# Patient Record
Sex: Female | Born: 1945
Health system: Southern US, Community
[De-identification: ages and names within clinical notes are randomized; demographics above are authoritative.]

## PROBLEM LIST (undated history)

## (undated) DIAGNOSIS — M353 Polymyalgia rheumatica: Secondary | ICD-10-CM

## (undated) DIAGNOSIS — I10 Essential (primary) hypertension: Secondary | ICD-10-CM

## (undated) DIAGNOSIS — F419 Anxiety disorder, unspecified: Secondary | ICD-10-CM

## (undated) DIAGNOSIS — IMO0002 Reserved for concepts with insufficient information to code with codable children: Secondary | ICD-10-CM

## (undated) DIAGNOSIS — M720 Palmar fascial fibromatosis [Dupuytren]: Secondary | ICD-10-CM

## (undated) DIAGNOSIS — F32A Depression, unspecified: Secondary | ICD-10-CM

## (undated) DIAGNOSIS — I4892 Unspecified atrial flutter: Secondary | ICD-10-CM

## (undated) DIAGNOSIS — M1990 Unspecified osteoarthritis, unspecified site: Secondary | ICD-10-CM

## (undated) DIAGNOSIS — R002 Palpitations: Secondary | ICD-10-CM

## (undated) DIAGNOSIS — E041 Nontoxic single thyroid nodule: Secondary | ICD-10-CM

## (undated) DIAGNOSIS — F329 Major depressive disorder, single episode, unspecified: Secondary | ICD-10-CM

## (undated) DIAGNOSIS — E669 Obesity, unspecified: Secondary | ICD-10-CM

## (undated) HISTORY — DX: Polymyalgia rheumatica: M35.3

## (undated) HISTORY — DX: Unspecified osteoarthritis, unspecified site: M19.90

## (undated) HISTORY — DX: Anxiety disorder, unspecified: F41.9

## (undated) HISTORY — DX: Unspecified atrial flutter: I48.92

## (undated) HISTORY — PX: NISSEN FUNDOPLICATION: SHX2091

## (undated) HISTORY — DX: Obesity, unspecified: E66.9

## (undated) HISTORY — DX: Palmar fascial fibromatosis (dupuytren): M72.0

## (undated) HISTORY — DX: Major depressive disorder, single episode, unspecified: F32.9

## (undated) HISTORY — DX: Essential (primary) hypertension: I10

## (undated) HISTORY — DX: Reserved for concepts with insufficient information to code with codable children: IMO0002

## (undated) HISTORY — DX: Nontoxic single thyroid nodule: E04.1

## (undated) HISTORY — DX: Palpitations: R00.2

## (undated) HISTORY — PX: KNEE SURGERY: SHX244

## (undated) HISTORY — PX: ABDOMINAL HYSTERECTOMY: SHX81

## (undated) HISTORY — DX: Depression, unspecified: F32.A

---

## 1980-02-13 HISTORY — PX: BREAST EXCISIONAL BIOPSY: SUR124

## 1998-03-04 ENCOUNTER — Ambulatory Visit (HOSPITAL_COMMUNITY): Admission: RE | Admit: 1998-03-04 | Discharge: 1998-03-04 | Payer: Self-pay | Admitting: Family Medicine

## 1999-03-10 ENCOUNTER — Other Ambulatory Visit: Admission: RE | Admit: 1999-03-10 | Discharge: 1999-03-10 | Payer: Self-pay | Admitting: Family Medicine

## 1999-03-17 ENCOUNTER — Encounter: Payer: Self-pay | Admitting: Family Medicine

## 1999-03-17 ENCOUNTER — Encounter: Admission: RE | Admit: 1999-03-17 | Discharge: 1999-03-17 | Payer: Self-pay | Admitting: Family Medicine

## 1999-04-07 ENCOUNTER — Encounter: Admission: RE | Admit: 1999-04-07 | Discharge: 1999-04-07 | Payer: Self-pay | Admitting: Family Medicine

## 1999-04-07 ENCOUNTER — Encounter: Payer: Self-pay | Admitting: Family Medicine

## 1999-05-21 ENCOUNTER — Emergency Department (HOSPITAL_COMMUNITY): Admission: EM | Admit: 1999-05-21 | Discharge: 1999-05-21 | Payer: Self-pay | Admitting: Emergency Medicine

## 1999-05-21 ENCOUNTER — Encounter: Payer: Self-pay | Admitting: Emergency Medicine

## 1999-07-09 ENCOUNTER — Encounter: Payer: Self-pay | Admitting: Internal Medicine

## 1999-07-09 ENCOUNTER — Emergency Department (HOSPITAL_COMMUNITY): Admission: EM | Admit: 1999-07-09 | Discharge: 1999-07-09 | Payer: Self-pay | Admitting: Emergency Medicine

## 1999-10-09 ENCOUNTER — Encounter: Admission: RE | Admit: 1999-10-09 | Discharge: 1999-10-09 | Payer: Self-pay | Admitting: Family Medicine

## 1999-10-09 ENCOUNTER — Encounter: Payer: Self-pay | Admitting: Family Medicine

## 2000-04-26 ENCOUNTER — Other Ambulatory Visit: Admission: RE | Admit: 2000-04-26 | Discharge: 2000-04-26 | Payer: Self-pay | Admitting: Family Medicine

## 2000-04-26 ENCOUNTER — Encounter: Payer: Self-pay | Admitting: Family Medicine

## 2000-04-26 ENCOUNTER — Encounter: Admission: RE | Admit: 2000-04-26 | Discharge: 2000-04-26 | Payer: Self-pay | Admitting: Family Medicine

## 2000-10-20 ENCOUNTER — Emergency Department (HOSPITAL_COMMUNITY): Admission: EM | Admit: 2000-10-20 | Discharge: 2000-10-20 | Payer: Self-pay | Admitting: Emergency Medicine

## 2000-11-16 ENCOUNTER — Encounter: Admission: RE | Admit: 2000-11-16 | Discharge: 2000-11-16 | Payer: Self-pay | Admitting: Family Medicine

## 2000-11-16 ENCOUNTER — Encounter: Payer: Self-pay | Admitting: Family Medicine

## 2001-01-15 ENCOUNTER — Encounter: Payer: Self-pay | Admitting: Neurology

## 2001-01-15 ENCOUNTER — Ambulatory Visit (HOSPITAL_COMMUNITY): Admission: RE | Admit: 2001-01-15 | Discharge: 2001-01-15 | Payer: Self-pay | Admitting: Neurology

## 2001-04-16 ENCOUNTER — Encounter: Admission: RE | Admit: 2001-04-16 | Discharge: 2001-04-16 | Payer: Self-pay | Admitting: Family Medicine

## 2001-04-16 ENCOUNTER — Encounter: Payer: Self-pay | Admitting: Family Medicine

## 2001-09-26 ENCOUNTER — Encounter: Payer: Self-pay | Admitting: Family Medicine

## 2001-09-26 ENCOUNTER — Encounter: Admission: RE | Admit: 2001-09-26 | Discharge: 2001-09-26 | Payer: Self-pay | Admitting: Radiation Oncology

## 2002-03-31 ENCOUNTER — Other Ambulatory Visit: Admission: RE | Admit: 2002-03-31 | Discharge: 2002-03-31 | Payer: Self-pay | Admitting: Family Medicine

## 2002-07-21 ENCOUNTER — Encounter: Payer: Self-pay | Admitting: Family Medicine

## 2002-07-21 ENCOUNTER — Encounter: Admission: RE | Admit: 2002-07-21 | Discharge: 2002-07-21 | Payer: Self-pay | Admitting: Family Medicine

## 2002-08-19 ENCOUNTER — Encounter: Payer: Self-pay | Admitting: Family Medicine

## 2002-08-19 ENCOUNTER — Encounter: Admission: RE | Admit: 2002-08-19 | Discharge: 2002-08-19 | Payer: Self-pay | Admitting: Family Medicine

## 2002-09-29 ENCOUNTER — Encounter: Admission: RE | Admit: 2002-09-29 | Discharge: 2002-09-29 | Payer: Self-pay | Admitting: Family Medicine

## 2002-09-29 ENCOUNTER — Encounter: Payer: Self-pay | Admitting: Family Medicine

## 2002-11-20 ENCOUNTER — Ambulatory Visit (HOSPITAL_COMMUNITY): Admission: RE | Admit: 2002-11-20 | Discharge: 2002-11-20 | Payer: Self-pay | Admitting: Neurology

## 2002-11-20 ENCOUNTER — Encounter: Payer: Self-pay | Admitting: Neurology

## 2002-12-06 ENCOUNTER — Encounter: Admission: RE | Admit: 2002-12-06 | Discharge: 2002-12-06 | Payer: Self-pay | Admitting: Orthopedic Surgery

## 2002-12-06 ENCOUNTER — Encounter: Payer: Self-pay | Admitting: Orthopedic Surgery

## 2002-12-15 ENCOUNTER — Ambulatory Visit (HOSPITAL_COMMUNITY): Admission: RE | Admit: 2002-12-15 | Discharge: 2002-12-15 | Payer: Self-pay | Admitting: Neurology

## 2003-06-02 ENCOUNTER — Ambulatory Visit (HOSPITAL_COMMUNITY): Admission: RE | Admit: 2003-06-02 | Discharge: 2003-06-02 | Payer: Self-pay | Admitting: Orthopedic Surgery

## 2003-06-02 ENCOUNTER — Ambulatory Visit (HOSPITAL_BASED_OUTPATIENT_CLINIC_OR_DEPARTMENT_OTHER): Admission: RE | Admit: 2003-06-02 | Discharge: 2003-06-02 | Payer: Self-pay | Admitting: Orthopedic Surgery

## 2003-06-18 ENCOUNTER — Encounter: Admission: RE | Admit: 2003-06-18 | Discharge: 2003-06-18 | Payer: Self-pay | Admitting: Family Medicine

## 2003-07-06 ENCOUNTER — Other Ambulatory Visit: Admission: RE | Admit: 2003-07-06 | Discharge: 2003-07-06 | Payer: Self-pay | Admitting: Endocrinology

## 2003-10-11 ENCOUNTER — Encounter: Admission: RE | Admit: 2003-10-11 | Discharge: 2003-10-11 | Payer: Self-pay | Admitting: Endocrinology

## 2003-10-12 ENCOUNTER — Ambulatory Visit (HOSPITAL_COMMUNITY): Admission: RE | Admit: 2003-10-12 | Discharge: 2003-10-12 | Payer: Self-pay | Admitting: Family Medicine

## 2004-03-27 ENCOUNTER — Encounter: Admission: RE | Admit: 2004-03-27 | Discharge: 2004-03-27 | Payer: Self-pay | Admitting: Endocrinology

## 2004-05-09 ENCOUNTER — Ambulatory Visit: Payer: Self-pay | Admitting: Gastroenterology

## 2004-05-11 ENCOUNTER — Ambulatory Visit: Payer: Self-pay | Admitting: Gastroenterology

## 2004-05-18 ENCOUNTER — Ambulatory Visit: Payer: Self-pay | Admitting: Gastroenterology

## 2004-10-12 ENCOUNTER — Ambulatory Visit (HOSPITAL_COMMUNITY): Admission: RE | Admit: 2004-10-12 | Discharge: 2004-10-12 | Payer: Self-pay | Admitting: Family Medicine

## 2005-10-22 ENCOUNTER — Ambulatory Visit (HOSPITAL_COMMUNITY): Admission: RE | Admit: 2005-10-22 | Discharge: 2005-10-22 | Payer: Self-pay | Admitting: Family Medicine

## 2006-05-24 ENCOUNTER — Encounter: Admission: RE | Admit: 2006-05-24 | Discharge: 2006-05-24 | Payer: Self-pay | Admitting: Family Medicine

## 2006-06-11 ENCOUNTER — Encounter: Admission: RE | Admit: 2006-06-11 | Discharge: 2006-06-11 | Payer: Self-pay | Admitting: Family Medicine

## 2006-08-30 ENCOUNTER — Encounter: Admission: RE | Admit: 2006-08-30 | Discharge: 2006-08-30 | Payer: Self-pay | Admitting: Family Medicine

## 2006-09-13 ENCOUNTER — Encounter: Admission: RE | Admit: 2006-09-13 | Discharge: 2006-09-13 | Payer: Self-pay | Admitting: Family Medicine

## 2006-11-01 ENCOUNTER — Encounter: Admission: RE | Admit: 2006-11-01 | Discharge: 2006-11-01 | Payer: Self-pay | Admitting: Family Medicine

## 2006-12-02 ENCOUNTER — Encounter: Admission: RE | Admit: 2006-12-02 | Discharge: 2006-12-02 | Payer: Self-pay | Admitting: Family Medicine

## 2006-12-16 ENCOUNTER — Encounter: Admission: RE | Admit: 2006-12-16 | Discharge: 2006-12-16 | Payer: Self-pay | Admitting: Family Medicine

## 2007-06-26 ENCOUNTER — Encounter: Admission: RE | Admit: 2007-06-26 | Discharge: 2007-06-26 | Payer: Self-pay | Admitting: Family Medicine

## 2007-11-03 ENCOUNTER — Encounter: Admission: RE | Admit: 2007-11-03 | Discharge: 2007-11-03 | Payer: Self-pay | Admitting: Family Medicine

## 2008-11-03 ENCOUNTER — Ambulatory Visit (HOSPITAL_COMMUNITY): Admission: RE | Admit: 2008-11-03 | Discharge: 2008-11-03 | Payer: Self-pay | Admitting: Geriatric Medicine

## 2008-11-09 ENCOUNTER — Encounter: Admission: RE | Admit: 2008-11-09 | Discharge: 2008-11-09 | Payer: Self-pay | Admitting: Geriatric Medicine

## 2008-11-17 ENCOUNTER — Encounter: Admission: RE | Admit: 2008-11-17 | Discharge: 2008-11-17 | Payer: Self-pay | Admitting: Geriatric Medicine

## 2009-04-21 ENCOUNTER — Encounter (INDEPENDENT_AMBULATORY_CARE_PROVIDER_SITE_OTHER): Payer: Self-pay | Admitting: *Deleted

## 2009-04-25 ENCOUNTER — Emergency Department (HOSPITAL_COMMUNITY): Admission: EM | Admit: 2009-04-25 | Discharge: 2009-04-25 | Payer: Self-pay | Admitting: Emergency Medicine

## 2009-05-03 DIAGNOSIS — I498 Other specified cardiac arrhythmias: Secondary | ICD-10-CM

## 2009-05-03 HISTORY — DX: Other specified cardiac arrhythmias: I49.8

## 2009-05-04 ENCOUNTER — Encounter: Admission: RE | Admit: 2009-05-04 | Discharge: 2009-05-04 | Payer: Self-pay | Admitting: Geriatric Medicine

## 2009-05-04 ENCOUNTER — Ambulatory Visit: Payer: Self-pay | Admitting: Internal Medicine

## 2009-05-04 DIAGNOSIS — I4892 Unspecified atrial flutter: Secondary | ICD-10-CM

## 2009-05-04 DIAGNOSIS — I10 Essential (primary) hypertension: Secondary | ICD-10-CM

## 2009-05-04 HISTORY — DX: Essential (primary) hypertension: I10

## 2009-05-04 HISTORY — DX: Unspecified atrial flutter: I48.92

## 2009-05-05 ENCOUNTER — Encounter: Payer: Self-pay | Admitting: Internal Medicine

## 2009-11-04 ENCOUNTER — Encounter: Admission: RE | Admit: 2009-11-04 | Discharge: 2009-11-04 | Payer: Self-pay | Admitting: Geriatric Medicine

## 2009-11-17 ENCOUNTER — Other Ambulatory Visit: Admission: RE | Admit: 2009-11-17 | Discharge: 2009-11-17 | Payer: Self-pay | Admitting: Geriatric Medicine

## 2009-11-21 ENCOUNTER — Encounter: Admission: RE | Admit: 2009-11-21 | Discharge: 2009-11-21 | Payer: Self-pay | Admitting: Geriatric Medicine

## 2010-03-05 ENCOUNTER — Encounter: Payer: Self-pay | Admitting: Family Medicine

## 2010-03-05 ENCOUNTER — Encounter: Payer: Self-pay | Admitting: Endocrinology

## 2010-03-16 NOTE — Letter (Signed)
Summary: ekg 04-25-2009  ekg 04-25-2009   Imported By: Faythe Ghee 05/05/2009 09:55:13  _____________________________________________________________________  External Attachment:    Type:   Image     Comment:   External Document

## 2010-03-16 NOTE — Letter (Signed)
Summary: Colonoscopy Date Change Letter  [facility]  [address] [place], [place] [address]   Phone: [phone]  Fax: [phone]      April 21, 2009 MRN: [id number]   [facility] [address] [place], [place]  [address]   Dear Ms. Reas,   Previously you were recommended to have a repeat colonoscopy around this time. Your chart was recently reviewed by Dr. Jarold Motto of [facility]. Follow up colonoscopy is now recommended in April 2016. This revised recommendation is based on current, nationally recognized guidelines for colorectal cancer screening and polyp surveillance. These guidelines are endorsed by the American Cancer Society, [employer] on Colorectal Cancer as well as numerous other major medical organizations.  Please understand that our recommendation assumes that you do not have any new symptoms such as bleeding, a change in bowel habits, anemia, or significant abdominal discomfort. If you do have any concerning GI symptoms or want to discuss the guideline recommendations, please call to arrange an office visit at your earliest convenience. Otherwise we will keep you in our reminder system and contact you 1-2 months prior to the date listed above to schedule your next colonoscopy.  Thank you,   Vania Rea. Jarold Motto, M.D.  [facility] [phone]

## 2010-03-16 NOTE — Assessment & Plan Note (Signed)
Summary: per Dr. Myrtis Ser seen in er on 3/14 SVT jr   Primary Provider:  Merlene Laughter  CC:  follow up hospital.  History of Present Illness: Melissa Finley is referred today by Dr. Myrtis Ser for evaluation of atrial flutter.  The patient has a h/o tobacco abuse and bronchitis and was seen in the ED with palpitations several weeks ago and was found to be in atrial flutter with 2:1 AV conduction at a rate of 160/min.  She was treated with adenosine by the ED attending on several occaisions demonstrating classic, isthmus dependent atrial flutter.  This eventually terminated.  She notes occaisional palpitations prior to this episode but none since then.  No c/p, sob or other symptoms.  Current Medications (verified): 1)  Alprazolam 0.5 Mg Tabs (Alprazolam) .Marland Kitchen.. 1 Tab Po Bid As Needed 2)  Cyclobenzaprine Hcl 5 Mg Tabs (Cyclobenzaprine Hcl) .Marland Kitchen.. 1 Tab By Mouth 4 Times A Day As Needed 3)  Diltiazem Hcl Er Beads 120 Mg Xr24h-Cap (Diltiazem Hcl Er Beads) .... Take One Capsule By Mouth Daily 4)  Lodrane 24d 12-90 Mg Xr24h-Cap (Brompheniramine-Pseudoeph) .Marland Kitchen.. 1to 2 Tabs By Mouth Once Daily 5)  Tramadol Hcl 50 Mg Tabs (Tramadol Hcl) .Marland Kitchen.. 1 To 2 Tabs Op Once Daily As Needed 6)  Iophen Dm-Nr 100-10 Mg/30ml Liqd (Dextromethorphan-Guaifenesin) .... As Needed 7)  Aspirin 81 Mg Tbec (Aspirin) .... Take One Tablet By Mouth Daily 8)  Aleve 220 Mg Tabs (Naproxen Sodium) .... As Needed 9)  Calcium 1200-1000 Mg-Unit Chew (Calcium Carbonate-Vit D-Min) .Marland Kitchen.. 1 Tab By Mouth Once Daily 10)  Fish Oil   Oil (Fish Oil) .Marland Kitchen.. 1 Tab By Mouth Once Daily 11)  L-Lysine 500 Mg Tabs (Lysine) .Marland Kitchen.. 1 Tab By Mouth Once Daily 12)  Super B Complex  Tabs (B Complex-C) .Marland Kitchen.. 1 Tab By Mouth Once Daily 13)  Bromday 0.09 % Soln (Bromfenac Sodium) .... As Directed 14)  Prednisolone Acetate 1 % Susp (Prednisolone Acetate) .... As Directed  Past History:  Past Medical History: Last updated: 05/03/2009 Current Problems:  SUPRAVENTRICULAR  TACHYCARDIA (ICD-427.89)    Past Surgical History: Knee surgery Nissen fundoplication Hysterectomy C-section  Family History: Cancer Alzheimers dementia CAD  Social History: married with 3 children 1/2 PPD smoker rare ETOH  Review of Systems       All systems reviewed and negative except as noted in the HPI  Vital Signs:  Patient profile:   65 year old female Height:      61 inches Weight:      173 pounds BMI:     32.81 Pulse rate:   86 / minute Resp:     12 per minute BP sitting:   111 / 74  (left arm)  Vitals Entered By: Kem Parkinson (May 04, 2009 8:45 AM)  Physical Exam  General:  Well developed, well nourished, in no acute distress.  HEENT: normal Neck: supple. No JVD. Carotids 2+ bilaterally no bruits Cor: RRR no rubs, gallops or murmur Lungs: CTA Ab: soft, nontender. nondistended. No HSM. Good bowel sounds Ext: warm. no cyanosis, clubbing or edema Neuro: alert and oriented. Grossly nonfocal. affect pleasant    EKG  Procedure date:  05/04/2009  Findings:      Normal sinus rhythm with rate of:  79.  Impression & Recommendations:  Problem # 1:  ATRIAL FLUTTER (ICD-427.32) Her symptoms are fairly minimal at this point and she is back in NSR.  She is not indicated for coumadin at this point and I have asked her  to call me if she has recurrent atrial flutter symptoms.  If so, catheter ablation would be indicated. Her updated medication list for this problem includes:    Aspirin 81 Mg Tbec (Aspirin) .Marland Kitchen... Take one tablet by mouth daily  Problem # 2:  ESSENTIAL HYPERTENSION, BENIGN (ICD-401.1) A low sodium diet is recommended and she will continue her current meds. Her updated medication list for this problem includes:    Diltiazem Hcl Er Beads 120 Mg Xr24h-cap (Diltiazem hcl er beads) .Marland Kitchen... Take one capsule by mouth daily    Aspirin 81 Mg Tbec (Aspirin) .Marland Kitchen... Take one tablet by mouth daily  Patient Instructions: 1)  Your physician wants you to  follow-up in:   12 months with Dr Ladona Ridgel. You will receive a reminder letter in the mail two months in advance. If you don't receive a letter, please call our office to schedule the follow-up appointment. Prescriptions: DILTIAZEM HCL ER BEADS 120 MG XR24H-CAP (DILTIAZEM HCL ER BEADS) Take one capsule by mouth daily  #90 x 3   Entered by:   Proofreader   Authorized by:   Laren Boom, MD, [facility]   Signed by:   Gypsy Balsam RN BSN on 05/04/2009   Method used:   Tax adviser to        [facility]* (mail-order)       [address]       [place], [place]  [address]       Ph: [phone]       Fax: [phone]   RxID:   [id number]

## 2010-04-27 ENCOUNTER — Encounter: Payer: Self-pay | Admitting: Internal Medicine

## 2010-04-27 ENCOUNTER — Ambulatory Visit (INDEPENDENT_AMBULATORY_CARE_PROVIDER_SITE_OTHER): Payer: Medicare Other | Admitting: Internal Medicine

## 2010-04-27 DIAGNOSIS — I4892 Unspecified atrial flutter: Secondary | ICD-10-CM

## 2010-04-27 DIAGNOSIS — I10 Essential (primary) hypertension: Secondary | ICD-10-CM

## 2010-05-02 NOTE — Assessment & Plan Note (Signed)
Summary: per check out/sf /cy/rs per pt call/gd/kl   Visit Type:  Follow-up Primary Provider:  Merlene Laughter   History of Present Illness: Melissa Finley returns today for followup. She is a pleasant 65 yo woman with a h/o Atrial flutter which has been short lived and infrequent in the past. She also has PMR and is on chronic steroids. She has had trouble controlling her weight. She also has HTN. She denies c/p, sob or peripheral edema. No syncope or near syncope.  Current Medications (verified): 1)  Alprazolam 0.5 Mg Tabs (Alprazolam) .Marland Kitchen.. 1 Tab Po Bid As Needed 2)  Cyclobenzaprine Hcl 5 Mg Tabs (Cyclobenzaprine Hcl) .Marland Kitchen.. 1 Tab By Mouth 4 Times A Day As Needed 3)  Diltiazem Hcl Er Beads 120 Mg Xr24h-Cap (Diltiazem Hcl Er Beads) .... Take One Capsule By Mouth Daily 4)  Lodrane 24d 12-90 Mg Xr24h-Cap (Brompheniramine-Pseudoeph) .Marland Kitchen.. 1to 2 Tabs By Mouth Once Daily 5)  Tramadol Hcl 50 Mg Tabs (Tramadol Hcl) .Marland Kitchen.. 1 To 2 Tabs Op Once Daily As Needed 6)  Aspirin 81 Mg Tbec (Aspirin) .... Take One Tablet By Mouth Daily 7)  Aleve 220 Mg Tabs (Naproxen Sodium) .... As Needed 8)  Calcium 1200-1000 Mg-Unit Chew (Calcium Carbonate-Vit D-Min) .Marland Kitchen.. 1 Tab By Mouth Once Daily 9)  Fish Oil   Oil (Fish Oil) .Marland Kitchen.. 1 Tab By Mouth Once Daily 10)  L-Lysine 500 Mg Tabs (Lysine) .Marland Kitchen.. 1 Tab By Mouth Once Daily 11)  Super B Complex  Tabs (B Complex-C) .Marland Kitchen.. 1 Tab By Mouth Once Daily 12)  Prednisone 20 Mg Tabs (Prednisone) .... Take 1 Tablet By Mouth Once A Day  Allergies (verified): No Known Drug Allergies  Past History:  Past Medical History: Last updated: 05/03/2009 Current Problems:  SUPRAVENTRICULAR TACHYCARDIA (ICD-427.89)    Past Surgical History: Last updated: 05/04/2009 Knee surgery Nissen fundoplication Hysterectomy C-section  Review of Systems  The patient denies chest pain, syncope, dyspnea on exertion, and peripheral edema.    Vital Signs:  Patient profile:   65 year old  female Height:      61 inches Weight:      182 pounds BMI:     34.51 Pulse rate:   81 / minute Pulse rhythm:   regular Resp:     18 per minute BP sitting:   129 / 65  (left arm) Cuff size:   large  Vitals Entered By: Vikki Ports (April 27, 2010 2:40 PM)  Physical Exam  General:  Obese, well developed, well nourished, in no acute distress.  HEENT: normal Neck: supple. No JVD. Carotids 2+ bilaterally no bruits Cor: RRR no rubs, gallops or murmur Lungs: CTA Ab: soft, nontender. nondistended. No HSM. Good bowel sounds Ext: warm. no cyanosis, clubbing or edema Neuro: alert and oriented. Grossly nonfocal. affect pleasant    EKG  Procedure date:  04/27/2010  Findings:      Normal sinus rhythm with rate of:  81  Impression & Recommendations:  Problem # 1:  ATRIAL FLUTTER (ICD-427.32) Her symptoms are quite infrequent. I have recommended a period of watchful waiting but if her symptoms worsen, then I would recommend considering ablation. Her updated medication list for this problem includes:    Aspirin 81 Mg Tbec (Aspirin) .Marland Kitchen... Take one tablet by mouth daily  Problem # 2:  ESSENTIAL HYPERTENSION, BENIGN (ICD-401.1) Her blood pressure is fairly well controlled despite her obesity and steroid use. Will follow. She will maintain a low sodium diet. Her updated medication list for this problem  includes:    Diltiazem Hcl Er Beads 120 Mg Xr24h-cap (Diltiazem hcl er beads) .Marland Kitchen... Take one capsule by mouth daily    Aspirin 81 Mg Tbec (Aspirin) .Marland Kitchen... Take one tablet by mouth daily  Patient Instructions: 1)  Your physician recommends that you continue on your current medications as directed. Please refer to the Current Medication list given to you today. 2)  Your physician wants you to follow-up in: YEAR WITH DR Court Joy will receive a reminder letter in the mail two months in advance. If you don't receive a letter, please call our office to schedule the follow-up appointment.

## 2010-05-08 ENCOUNTER — Encounter: Payer: Self-pay | Admitting: Internal Medicine

## 2010-05-08 LAB — CBC
HCT: 47.2 % — ABNORMAL HIGH (ref 36.0–46.0)
Hemoglobin: 16 g/dL — ABNORMAL HIGH (ref 12.0–15.0)
MCHC: 33.9 g/dL (ref 30.0–36.0)
MCV: 99.8 fL (ref 78.0–100.0)

## 2010-05-08 LAB — POCT I-STAT, CHEM 8
Chloride: 109 mEq/L (ref 96–112)
Creatinine, Ser: 0.5 mg/dL (ref 0.4–1.2)
Glucose, Bld: 101 mg/dL — ABNORMAL HIGH (ref 70–99)
HCT: 48 % — ABNORMAL HIGH (ref 36.0–46.0)
Potassium: 4.1 mEq/L (ref 3.5–5.1)
Sodium: 139 mEq/L (ref 135–145)

## 2010-05-08 LAB — DIFFERENTIAL
Eosinophils Relative: 2 % (ref 0–5)
Neutro Abs: 7.5 10*3/uL (ref 1.7–7.7)
Neutrophils Relative %: 65 % (ref 43–77)

## 2010-05-08 LAB — POCT CARDIAC MARKERS

## 2010-05-31 ENCOUNTER — Telehealth: Payer: Self-pay | Admitting: Internal Medicine

## 2010-05-31 NOTE — Telephone Encounter (Signed)
Pt req refill of [facility]iazem [facility] [place]-[facility] day supply

## 2010-06-01 MED ORDER — DILTIAZEM HCL ER COATED BEADS 120 MG PO CP24: 120.0000 mg | ORAL_CAPSULE | Freq: Every day | ORAL | Status: DC

## 2010-06-30 NOTE — Op Note (Signed)
NAME:  Melissa Finley, Melissa Finley                      ACCOUNT NO.:  [id number]   MEDICAL RECORD NO.:  [id number]                   PATIENT TYPE:  AMB   LOCATION:  DSC                                  FACILITY:  [facility]   PHYSICIAN:  Mila Homer. Sherlean Foot, M.D.              DATE OF BIRTH:  Jul 12, 1945   DATE OF PROCEDURE:  06/02/2003  DATE OF DISCHARGE:                                 OPERATIVE REPORT   PREOPERATIVE DIAGNOSIS:  Left knee internal derangement with probable  meniscus tear versus OA versus plica syndrome.   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   SURGEON:  Mila Homer. Sherlean Foot, M.D.   ASSISTANT:  None.   ANESTHESIA:  General LMA.   INDICATIONS FOR PROCEDURE:  The patient is a 65 year old with a negative MRI  six months ago, but continued with  mechanical symptoms, conservative  measures failed and informed consent was obtained.   DESCRIPTION OF PROCEDURE:  The patient was laid supine, administered general  anesthesia, left lower extremity was prepped and draped in the usual sterile  fashion.  #11 blade blunt trocar and cannula were used to create an  inferolateral and inferomedial portal.  A diagnostic arthroscopy was  performed. There was grade 1 chondromalacia on areas of the patella,  virtually none in the trochlea.  There were no loose bodies in the gutters.  __________  flexion, noticed wear on the medial femoral condylar edge from a  large hypertrophied plica.  After debriding with a small gray-white shaver,  it was obvious that this went all the way down to bone and was about an 8 x  8 mm area of grade 4 chondromalacia from the wear of the plica.  Then went  into the medial compartment with valgus stress and the palpated the  posterior horn of the meniscus and it was in fact intact, and there was  virtually no chondromalacia in the medial compartment whatsoever.  The ACL  and PCL also appeared normal.  Then, went  into the figure four position and  palpated the lateral meniscus.  That also appeared normal and there was no  chondromalacia there either.  Then, went back into the flexion position,  finished the debridement and chondroplasty of the area on the medial femoral  condyle, then performed a plicectomy and used the 90 degree arthroscope,  wanted to cauterize bleeding vessels to obtain hemostasis.  I then irrigated  and closed with interrupted 4-0 nylon sutures, dressed with Xeroform  dressings, sponges and a sterile __________  Ace wrap.  Complications none.  Drains none.  Estimated blood loss minimal.  Tourniquet time none.  Mila Homer. Sherlean Foot, M.D.    SDL/MEDQ  D:  06/02/2003  T:  06/03/2003  Job:  [id number]

## 2010-10-17 ENCOUNTER — Other Ambulatory Visit: Payer: Self-pay | Admitting: Geriatric Medicine

## 2010-10-17 DIAGNOSIS — Z1231 Encounter for screening mammogram for malignant neoplasm of breast: Secondary | ICD-10-CM

## 2010-11-06 ENCOUNTER — Ambulatory Visit
Admission: RE | Admit: 2010-11-06 | Discharge: 2010-11-06 | Disposition: A | Discharge status: Home / Self Care | Payer: Medicare Other | Source: Ambulatory Visit | Attending: Geriatric Medicine | Admitting: Geriatric Medicine

## 2010-11-06 DIAGNOSIS — Z1231 Encounter for screening mammogram for malignant neoplasm of breast: Secondary | ICD-10-CM

## 2010-11-20 ENCOUNTER — Other Ambulatory Visit: Payer: Self-pay | Admitting: Geriatric Medicine

## 2010-11-20 DIAGNOSIS — E041 Nontoxic single thyroid nodule: Secondary | ICD-10-CM

## 2010-11-23 ENCOUNTER — Ambulatory Visit
Admission: RE | Admit: 2010-11-23 | Discharge: 2010-11-23 | Disposition: A | Discharge status: Home / Self Care | Payer: Medicare Other | Source: Ambulatory Visit | Attending: Geriatric Medicine | Admitting: Geriatric Medicine

## 2010-11-23 DIAGNOSIS — E041 Nontoxic single thyroid nodule: Secondary | ICD-10-CM

## 2011-05-17 ENCOUNTER — Encounter: Payer: Self-pay | Admitting: Internal Medicine

## 2011-05-24 ENCOUNTER — Ambulatory Visit (INDEPENDENT_AMBULATORY_CARE_PROVIDER_SITE_OTHER): Payer: Medicare Other | Admitting: Nurse Practitioner

## 2011-05-24 ENCOUNTER — Encounter: Payer: Self-pay | Admitting: Nurse Practitioner

## 2011-05-24 VITALS — BP 128/80 | HR 75 | Ht 62.0 in | Wt 184.0 lb

## 2011-05-24 DIAGNOSIS — I10 Essential (primary) hypertension: Secondary | ICD-10-CM | POA: Insufficient documentation

## 2011-05-24 DIAGNOSIS — I4892 Unspecified atrial flutter: Secondary | ICD-10-CM | POA: Insufficient documentation

## 2011-05-24 DIAGNOSIS — R002 Palpitations: Secondary | ICD-10-CM

## 2011-05-24 MED ORDER — METOPROLOL TARTRATE 25 MG PO TABS: ORAL_TABLET | ORAL | Status: DC

## 2011-05-24 NOTE — Progress Notes (Signed)
Patient Name: Melissa Finley Date of Encounter: 05/24/2011  Primary Care Provider:  Ginette Otto, MD, MD Primary Cardiologist:  Reece Agar. Ladona Ridgel, MD  Patient Profile  66 year old female with history of paroxysmal atrial flutter who presents with recurrent palpitations.  Problem List   Past Medical History  Diagnosis Date  . Paroxysmal atrial flutter   . HTN (hypertension)   . PMR (polymyalgia rheumatica)   . Osteoarthritis     a. knees  . Depression   . Anxiety   . Vitamin d deficiency   . Allergic rhinitis   . Obesity   . Thyroid nodule     a. negative bx 2001 - right lobe  . DDD (degenerative disc disease)     a. L5-S1, MRI 05/2006  . Dupuytren's contracture   . Palpitations    Past Surgical History  Procedure Date  . Nissen fundoplication   . Cesarean section   . Knee surgery   . Abdominal hysterectomy     Allergies  No Known Allergies  HPI  66 year old female with the above problem list.  Over the past year, patient has been having more frequent palpitations.  In the fall of 2012, her diltiazem dose was increased from 120 per day to 240 per day.  She has not noticed a significant decrease in the amount of palpitations that she experiences since this change and if anything she has had slight increase in frequency.  An episode of palpitations may occur as much as 2-3 times per week at rates of 160-170, lasting 15-45 minutes and resolving spontaneously.  Besides palpitations, she states mild dyspnea and anxiety during these episodes.  Home Medications  Prior to Admission medications   Medication Sig Start Date End Date Taking? Authorizing Provider  aspirin 81 MG tablet Take 81 mg by mouth daily.   Yes Historical Provider, MD  B Complex Vitamins (VITAMIN B COMPLEX PO) Take by mouth daily.   Yes Historical Provider, MD  Calcium Carbonate-Vitamin D (CALCIUM 600 + D PO) Take by mouth 2 (two) times daily.   Yes Historical Provider, MD  diltiazem (CARDIZEM CD)  120 MG 24 hr capsule Take 240 mg by mouth daily. 06/01/10 06/01/11 Yes Marinus Maw, MD  loratadine (CLARITIN) 10 MG tablet Take 10 mg by mouth daily.   Yes Historical Provider, MD  LYSINE PO Take by mouth daily.   Yes Historical Provider, MD  Omega-3 Fatty Acids (FISH OIL) 1000 MG CAPS Take by mouth 2 (two) times daily.   Yes Historical Provider, MD  ALPRAZolam Prudy Feeler) 0.5 MG tablet as needed. 05/21/11   Historical Provider, MD  cyclobenzaprine (FLEXERIL) 5 MG tablet as needed. 03/13/11   Historical Provider, MD    Review of Systems tachypalpitations mild dyspnea and anxiety as outlined in hpi.  No chest pain, pnd, orthopnea, n, v, dizziness, syncope. All other systems reviewed and are otherwise negative except as noted above.  Physical Exam  Blood pressure 128/80, pulse 75, height 5\' 2"  (1.575 m), weight 184 lb (83.462 kg).  General: Pleasant, NAD Psych: Normal affect. Neuro: Alert and oriented X 3. Moves all extremities spontaneously. HEENT: Normal  Neck: Supple without bruits or JVD. Lungs:  Resp regular and unlabored, CTA. Heart: RRR no s3, s4, or murmurs. Abdomen: Soft, non-tender, non-distended, BS + x 4.  Extremities: No clubbing, cyanosis.  Trace bilat LEE with lower extremity tenderness to palpation. DP/PT/Radials 2+ and equal bilaterally.  Accessory Clinical Findings  ECG - regular sinus rhythm, 75, no acute ST  or T changes.  Assessment & Plan  1.  Palpitations:  Patient has had increasing frequency of palpitations over the past year and more specifically in the past 6 months.  She has not seen a significant improvement with titration of calcium channel blocker therapy.  We will obtain a 2-D echocardiogram to rule out structural abnormality and also a 21 day event monitor to better define her arrhythmia which may very well be recurrent atrial flutter.  I am adding low-dose beta blocker therapy to her daily regimen (Lopressor 25 mg b.i.d. And an additional half a tablet p.r.n.  Palpitations).  Should be noted that she recently had laboratory evaluation her premature provider and that her TSH was normal.  2.  Paroxysmal atrial flutter:  See #1.  Patient is on aspirin therapy.  She has a CHADS2 score of one(hypertension).  3.  Hypertension:  Stable.  4.  Disposition:  Followup with Dr. Ladona Ridgel in 4 weeks.  Nicolasa Ducking, NP 05/24/2011, 1:16 PM

## 2011-05-24 NOTE — Patient Instructions (Signed)
Your physician recommends that you schedule a follow-up appointment in: 4 weeks with Dr Ladona Ridgel Your physician has requested that you have an echocardiogram. Echocardiography is a painless test that uses sound waves to create images of your heart. It provides your doctor with information about the size and shape of your heart and how well your heart's chambers and valves are working. This procedure takes approximately one hour. There are no restrictions for this procedure.  Your physician has recommended that you wear an event monitor. Event monitors are medical devices that record the heart's electrical activity. Doctors most often [place] these monitors to diagnose arrhythmias. Arrhythmias are problems with the speed or rhythm of the heartbeat. The monitor is a small, portable device. You can wear one while you do your normal daily activities. This is usually used to diagnose what is causing palpitations/syncope (passing out). (21 DAYS)  Your physician has recommended you make the following change in your medication: START Metoprolol 25 mg twice daily you may take an extra 1/2 tab as need for palpitations

## 2011-06-05 ENCOUNTER — Other Ambulatory Visit (HOSPITAL_COMMUNITY): Payer: Medicare Other

## 2011-06-29 ENCOUNTER — Ambulatory Visit: Payer: Medicare Other | Admitting: Internal Medicine

## 2011-07-03 ENCOUNTER — Other Ambulatory Visit (HOSPITAL_COMMUNITY): Payer: Medicare Other

## 2011-07-31 ENCOUNTER — Telehealth: Payer: Self-pay | Admitting: Internal Medicine

## 2011-07-31 NOTE — Telephone Encounter (Signed)
New Problem:      I called the patient to reschedule her ECHO that she had canceled and was only about to leave a message with my name, the reason I called and our return phone number.  

## 2011-08-03 NOTE — Telephone Encounter (Signed)
New Problem:      I called the patient to reschedule her ECHO that she had canceled and was only about to leave a message with my name, the reason I called and our return phone number.

## 2011-08-10 ENCOUNTER — Ambulatory Visit: Payer: Medicare Other | Admitting: Internal Medicine

## 2011-10-01 ENCOUNTER — Other Ambulatory Visit (HOSPITAL_COMMUNITY): Payer: Self-pay | Admitting: Geriatric Medicine

## 2011-10-01 ENCOUNTER — Other Ambulatory Visit: Payer: Self-pay | Admitting: Geriatric Medicine

## 2011-10-01 DIAGNOSIS — Z1231 Encounter for screening mammogram for malignant neoplasm of breast: Secondary | ICD-10-CM

## 2011-11-07 ENCOUNTER — Ambulatory Visit
Admission: RE | Admit: 2011-11-07 | Discharge: 2011-11-07 | Disposition: A | Discharge status: Home / Self Care | Payer: Medicare Other | Source: Ambulatory Visit | Attending: Geriatric Medicine | Admitting: Geriatric Medicine

## 2011-11-07 DIAGNOSIS — Z1231 Encounter for screening mammogram for malignant neoplasm of breast: Secondary | ICD-10-CM

## 2011-11-26 ENCOUNTER — Other Ambulatory Visit: Payer: Self-pay | Admitting: Geriatric Medicine

## 2011-11-26 ENCOUNTER — Ambulatory Visit
Admission: RE | Admit: 2011-11-26 | Discharge: 2011-11-26 | Disposition: A | Discharge status: Home / Self Care | Payer: Medicare Other | Source: Ambulatory Visit | Attending: Geriatric Medicine | Admitting: Geriatric Medicine

## 2011-11-26 DIAGNOSIS — E041 Nontoxic single thyroid nodule: Secondary | ICD-10-CM

## 2011-11-26 DIAGNOSIS — R1310 Dysphagia, unspecified: Secondary | ICD-10-CM

## 2011-11-29 ENCOUNTER — Ambulatory Visit
Admission: RE | Admit: 2011-11-29 | Discharge: 2011-11-29 | Disposition: A | Discharge status: Home / Self Care | Payer: Medicare Other | Source: Ambulatory Visit | Attending: Geriatric Medicine | Admitting: Geriatric Medicine

## 2011-11-29 DIAGNOSIS — R1310 Dysphagia, unspecified: Secondary | ICD-10-CM

## 2011-12-07 ENCOUNTER — Ambulatory Visit (HOSPITAL_COMMUNITY): Payer: Medicare Other | Attending: Nurse Practitioner

## 2011-12-07 ENCOUNTER — Encounter (INDEPENDENT_AMBULATORY_CARE_PROVIDER_SITE_OTHER): Payer: Medicare Other

## 2011-12-07 DIAGNOSIS — I4892 Unspecified atrial flutter: Secondary | ICD-10-CM

## 2011-12-07 DIAGNOSIS — I10 Essential (primary) hypertension: Secondary | ICD-10-CM | POA: Insufficient documentation

## 2011-12-07 DIAGNOSIS — I059 Rheumatic mitral valve disease, unspecified: Secondary | ICD-10-CM | POA: Insufficient documentation

## 2011-12-07 DIAGNOSIS — R002 Palpitations: Secondary | ICD-10-CM

## 2011-12-07 NOTE — Progress Notes (Signed)
Echocardiogram performed.  

## 2012-02-15 ENCOUNTER — Telehealth: Payer: Self-pay | Admitting: Internal Medicine

## 2012-02-15 NOTE — Telephone Encounter (Signed)
Spoke with patient and she was given the results of her monitor.  She had 2 episodes that the monitor did not capture she says.  She says the [employer] people were rude to her.  She has had one episode of fast HR since she has take the monitor off.  These episodes are not associated with pain, sob, dizziness.  She just feels her heart racing.  I have told her that if she has another episode that last for hours she can either go the Fire department or come here if it is during business hours and I will get an EKG on her.  She has verbalized the understanding and will do so.

## 2012-02-15 NOTE — Telephone Encounter (Signed)
Pt rtn call to [facility]

## 2012-09-30 ENCOUNTER — Other Ambulatory Visit: Payer: Self-pay

## 2012-09-30 DIAGNOSIS — Z1231 Encounter for screening mammogram for malignant neoplasm of breast: Secondary | ICD-10-CM

## 2012-11-07 ENCOUNTER — Ambulatory Visit
Admission: RE | Admit: 2012-11-07 | Discharge: 2012-11-07 | Disposition: A | Discharge status: Home / Self Care | Payer: Medicare Other | Source: Ambulatory Visit

## 2012-11-07 DIAGNOSIS — Z1231 Encounter for screening mammogram for malignant neoplasm of breast: Secondary | ICD-10-CM

## 2012-12-09 ENCOUNTER — Other Ambulatory Visit: Payer: Self-pay | Admitting: Geriatric Medicine

## 2012-12-09 DIAGNOSIS — E049 Nontoxic goiter, unspecified: Secondary | ICD-10-CM

## 2012-12-10 ENCOUNTER — Ambulatory Visit
Admission: RE | Admit: 2012-12-10 | Discharge: 2012-12-10 | Disposition: A | Discharge status: Home / Self Care | Payer: Self-pay | Source: Ambulatory Visit | Attending: Geriatric Medicine | Admitting: Geriatric Medicine

## 2012-12-10 DIAGNOSIS — E049 Nontoxic goiter, unspecified: Secondary | ICD-10-CM

## 2013-01-20 ENCOUNTER — Other Ambulatory Visit: Payer: Self-pay | Admitting: Geriatric Medicine

## 2013-01-20 DIAGNOSIS — Z87891 Personal history of nicotine dependence: Secondary | ICD-10-CM

## 2013-01-26 ENCOUNTER — Other Ambulatory Visit: Payer: Medicare Other

## 2013-01-30 ENCOUNTER — Ambulatory Visit
Admission: RE | Admit: 2013-01-30 | Discharge: 2013-01-30 | Disposition: A | Discharge status: Home / Self Care | Payer: No Typology Code available for payment source | Source: Ambulatory Visit | Attending: Geriatric Medicine | Admitting: Geriatric Medicine

## 2013-01-30 DIAGNOSIS — Z87891 Personal history of nicotine dependence: Secondary | ICD-10-CM

## 2013-10-20 ENCOUNTER — Other Ambulatory Visit: Payer: Self-pay

## 2013-10-20 ENCOUNTER — Other Ambulatory Visit (HOSPITAL_COMMUNITY): Payer: Self-pay | Admitting: Geriatric Medicine

## 2013-10-20 DIAGNOSIS — Z1231 Encounter for screening mammogram for malignant neoplasm of breast: Secondary | ICD-10-CM

## 2013-11-09 ENCOUNTER — Ambulatory Visit: Payer: Medicare Other

## 2013-11-24 ENCOUNTER — Ambulatory Visit
Admission: RE | Admit: 2013-11-24 | Discharge: 2013-11-24 | Disposition: A | Discharge status: Home / Self Care | Payer: Commercial Managed Care - HMO | Source: Ambulatory Visit

## 2013-11-24 DIAGNOSIS — Z1231 Encounter for screening mammogram for malignant neoplasm of breast: Secondary | ICD-10-CM

## 2013-11-26 ENCOUNTER — Other Ambulatory Visit: Payer: Self-pay | Admitting: Geriatric Medicine

## 2013-11-26 DIAGNOSIS — R928 Other abnormal and inconclusive findings on diagnostic imaging of breast: Secondary | ICD-10-CM

## 2013-12-08 ENCOUNTER — Ambulatory Visit
Admission: RE | Admit: 2013-12-08 | Discharge: 2013-12-08 | Disposition: A | Discharge status: Home / Self Care | Payer: Commercial Managed Care - HMO | Source: Ambulatory Visit | Attending: Geriatric Medicine | Admitting: Geriatric Medicine

## 2013-12-08 ENCOUNTER — Other Ambulatory Visit: Payer: Self-pay | Admitting: Geriatric Medicine

## 2013-12-08 DIAGNOSIS — R928 Other abnormal and inconclusive findings on diagnostic imaging of breast: Secondary | ICD-10-CM

## 2013-12-23 ENCOUNTER — Other Ambulatory Visit: Payer: Self-pay | Admitting: Geriatric Medicine

## 2013-12-23 DIAGNOSIS — E049 Nontoxic goiter, unspecified: Secondary | ICD-10-CM

## 2013-12-23 DIAGNOSIS — R911 Solitary pulmonary nodule: Secondary | ICD-10-CM

## 2013-12-29 ENCOUNTER — Other Ambulatory Visit: Payer: Self-pay | Admitting: Geriatric Medicine

## 2013-12-29 DIAGNOSIS — R911 Solitary pulmonary nodule: Secondary | ICD-10-CM

## 2013-12-30 ENCOUNTER — Inpatient Hospital Stay: Admission: RE | Admit: 2013-12-30 | Payer: Commercial Managed Care - HMO | Source: Ambulatory Visit

## 2013-12-30 ENCOUNTER — Other Ambulatory Visit: Payer: Commercial Managed Care - HMO

## 2014-01-19 ENCOUNTER — Other Ambulatory Visit: Payer: Self-pay | Admitting: Orthopedic Surgery

## 2014-01-19 DIAGNOSIS — R52 Pain, unspecified: Secondary | ICD-10-CM

## 2014-01-19 DIAGNOSIS — R609 Edema, unspecified: Secondary | ICD-10-CM

## 2014-01-21 ENCOUNTER — Ambulatory Visit
Admission: RE | Admit: 2014-01-21 | Discharge: 2014-01-21 | Disposition: A | Discharge status: Home / Self Care | Payer: Commercial Managed Care - HMO | Source: Ambulatory Visit | Attending: Orthopedic Surgery | Admitting: Orthopedic Surgery

## 2014-01-21 DIAGNOSIS — R52 Pain, unspecified: Secondary | ICD-10-CM

## 2014-01-21 DIAGNOSIS — R609 Edema, unspecified: Secondary | ICD-10-CM

## 2014-02-01 ENCOUNTER — Other Ambulatory Visit: Payer: Commercial Managed Care - HMO

## 2014-02-08 ENCOUNTER — Ambulatory Visit
Admission: RE | Admit: 2014-02-08 | Discharge: 2014-02-08 | Disposition: A | Discharge status: Home / Self Care | Payer: Commercial Managed Care - HMO | Source: Ambulatory Visit | Attending: Geriatric Medicine | Admitting: Geriatric Medicine

## 2014-02-08 DIAGNOSIS — S83512A Sprain of anterior cruciate ligament of left knee, initial encounter: Secondary | ICD-10-CM

## 2014-02-08 DIAGNOSIS — M25562 Pain in left knee: Secondary | ICD-10-CM | POA: Insufficient documentation

## 2014-02-08 DIAGNOSIS — R911 Solitary pulmonary nodule: Secondary | ICD-10-CM

## 2014-02-08 DIAGNOSIS — E049 Nontoxic goiter, unspecified: Secondary | ICD-10-CM

## 2014-02-08 HISTORY — DX: Pain in left knee: M25.562

## 2014-02-08 HISTORY — DX: Sprain of anterior cruciate ligament of left knee, initial encounter: S83.512A

## 2014-02-16 DIAGNOSIS — M25562 Pain in left knee: Secondary | ICD-10-CM | POA: Diagnosis not present

## 2014-02-19 DIAGNOSIS — M25562 Pain in left knee: Secondary | ICD-10-CM | POA: Diagnosis not present

## 2014-02-23 DIAGNOSIS — M25562 Pain in left knee: Secondary | ICD-10-CM | POA: Diagnosis not present

## 2014-02-24 ENCOUNTER — Other Ambulatory Visit: Payer: Self-pay | Admitting: General Surgery

## 2014-02-24 DIAGNOSIS — L720 Epidermal cyst: Secondary | ICD-10-CM | POA: Diagnosis not present

## 2014-02-24 DIAGNOSIS — L723 Sebaceous cyst: Secondary | ICD-10-CM | POA: Diagnosis not present

## 2014-02-27 NOTE — Progress Notes (Signed)
Quick Note:  Inform patient of Pathology report,. Benign Cyst, as expected.  hmi ______

## 2014-03-02 DIAGNOSIS — M25562 Pain in left knee: Secondary | ICD-10-CM | POA: Diagnosis not present

## 2014-03-04 DIAGNOSIS — M25562 Pain in left knee: Secondary | ICD-10-CM | POA: Diagnosis not present

## 2014-03-09 DIAGNOSIS — M25562 Pain in left knee: Secondary | ICD-10-CM | POA: Diagnosis not present

## 2014-03-11 DIAGNOSIS — M25562 Pain in left knee: Secondary | ICD-10-CM | POA: Diagnosis not present

## 2014-03-16 DIAGNOSIS — M25562 Pain in left knee: Secondary | ICD-10-CM | POA: Diagnosis not present

## 2014-03-18 DIAGNOSIS — S83512D Sprain of anterior cruciate ligament of left knee, subsequent encounter: Secondary | ICD-10-CM | POA: Diagnosis not present

## 2014-03-18 DIAGNOSIS — M25562 Pain in left knee: Secondary | ICD-10-CM | POA: Diagnosis not present

## 2014-03-25 DIAGNOSIS — M25562 Pain in left knee: Secondary | ICD-10-CM | POA: Diagnosis not present

## 2014-03-31 DIAGNOSIS — M25562 Pain in left knee: Secondary | ICD-10-CM | POA: Diagnosis not present

## 2014-04-07 DIAGNOSIS — M25562 Pain in left knee: Secondary | ICD-10-CM | POA: Diagnosis not present

## 2014-04-14 DIAGNOSIS — M25562 Pain in left knee: Secondary | ICD-10-CM | POA: Diagnosis not present

## 2014-04-15 ENCOUNTER — Other Ambulatory Visit: Payer: Self-pay | Admitting: Orthopedic Surgery

## 2014-04-15 DIAGNOSIS — R609 Edema, unspecified: Secondary | ICD-10-CM

## 2014-04-15 DIAGNOSIS — S83512D Sprain of anterior cruciate ligament of left knee, subsequent encounter: Secondary | ICD-10-CM | POA: Diagnosis not present

## 2014-04-15 DIAGNOSIS — R52 Pain, unspecified: Secondary | ICD-10-CM

## 2014-04-16 ENCOUNTER — Ambulatory Visit
Admission: RE | Admit: 2014-04-16 | Discharge: 2014-04-16 | Disposition: A | Discharge status: Home / Self Care | Payer: Commercial Managed Care - HMO | Source: Ambulatory Visit | Attending: Orthopedic Surgery | Admitting: Orthopedic Surgery

## 2014-04-16 DIAGNOSIS — R52 Pain, unspecified: Secondary | ICD-10-CM

## 2014-04-16 DIAGNOSIS — G4700 Insomnia, unspecified: Secondary | ICD-10-CM | POA: Diagnosis not present

## 2014-04-16 DIAGNOSIS — M2241 Chondromalacia patellae, right knee: Secondary | ICD-10-CM | POA: Diagnosis not present

## 2014-04-16 DIAGNOSIS — R609 Edema, unspecified: Secondary | ICD-10-CM

## 2014-04-16 DIAGNOSIS — L989 Disorder of the skin and subcutaneous tissue, unspecified: Secondary | ICD-10-CM | POA: Diagnosis not present

## 2014-04-16 DIAGNOSIS — M25361 Other instability, right knee: Secondary | ICD-10-CM | POA: Diagnosis not present

## 2014-04-22 DIAGNOSIS — M13861 Other specified arthritis, right knee: Secondary | ICD-10-CM | POA: Diagnosis not present

## 2014-04-29 ENCOUNTER — Encounter: Payer: Self-pay | Admitting: Internal Medicine

## 2014-05-06 ENCOUNTER — Other Ambulatory Visit: Payer: Self-pay | Admitting: Physician Assistant

## 2014-05-06 DIAGNOSIS — D1801 Hemangioma of skin and subcutaneous tissue: Secondary | ICD-10-CM | POA: Diagnosis not present

## 2014-05-06 DIAGNOSIS — D485 Neoplasm of uncertain behavior of skin: Secondary | ICD-10-CM | POA: Diagnosis not present

## 2014-05-06 DIAGNOSIS — L814 Other melanin hyperpigmentation: Secondary | ICD-10-CM | POA: Diagnosis not present

## 2014-05-06 DIAGNOSIS — L821 Other seborrheic keratosis: Secondary | ICD-10-CM | POA: Diagnosis not present

## 2014-05-06 DIAGNOSIS — D225 Melanocytic nevi of trunk: Secondary | ICD-10-CM | POA: Diagnosis not present

## 2014-05-06 DIAGNOSIS — L430 Hypertrophic lichen planus: Secondary | ICD-10-CM | POA: Diagnosis not present

## 2014-05-27 DIAGNOSIS — S83512D Sprain of anterior cruciate ligament of left knee, subsequent encounter: Secondary | ICD-10-CM | POA: Diagnosis not present

## 2014-06-25 DIAGNOSIS — H5210 Myopia, unspecified eye: Secondary | ICD-10-CM | POA: Diagnosis not present

## 2014-06-25 DIAGNOSIS — H5213 Myopia, bilateral: Secondary | ICD-10-CM | POA: Diagnosis not present

## 2014-07-22 DIAGNOSIS — Z0100 Encounter for examination of eyes and vision without abnormal findings: Secondary | ICD-10-CM | POA: Diagnosis not present

## 2014-07-22 DIAGNOSIS — H524 Presbyopia: Secondary | ICD-10-CM | POA: Diagnosis not present

## 2014-07-22 DIAGNOSIS — H5203 Hypermetropia, bilateral: Secondary | ICD-10-CM | POA: Diagnosis not present

## 2014-07-22 DIAGNOSIS — H52209 Unspecified astigmatism, unspecified eye: Secondary | ICD-10-CM | POA: Diagnosis not present

## 2014-11-02 ENCOUNTER — Encounter: Payer: Self-pay | Admitting: Internal Medicine

## 2014-12-27 ENCOUNTER — Other Ambulatory Visit: Payer: Self-pay | Admitting: Geriatric Medicine

## 2014-12-27 DIAGNOSIS — Z0000 Encounter for general adult medical examination without abnormal findings: Secondary | ICD-10-CM | POA: Diagnosis not present

## 2014-12-27 DIAGNOSIS — Z79899 Other long term (current) drug therapy: Secondary | ICD-10-CM | POA: Diagnosis not present

## 2014-12-27 DIAGNOSIS — I483 Typical atrial flutter: Secondary | ICD-10-CM | POA: Diagnosis not present

## 2014-12-27 DIAGNOSIS — R911 Solitary pulmonary nodule: Secondary | ICD-10-CM

## 2014-12-27 DIAGNOSIS — E049 Nontoxic goiter, unspecified: Secondary | ICD-10-CM | POA: Diagnosis not present

## 2014-12-27 DIAGNOSIS — F3340 Major depressive disorder, recurrent, in remission, unspecified: Secondary | ICD-10-CM | POA: Diagnosis not present

## 2014-12-27 DIAGNOSIS — M353 Polymyalgia rheumatica: Secondary | ICD-10-CM | POA: Diagnosis not present

## 2014-12-27 DIAGNOSIS — Z7189 Other specified counseling: Secondary | ICD-10-CM | POA: Diagnosis not present

## 2014-12-28 ENCOUNTER — Other Ambulatory Visit: Payer: Self-pay

## 2014-12-28 DIAGNOSIS — Z1231 Encounter for screening mammogram for malignant neoplasm of breast: Secondary | ICD-10-CM

## 2015-01-17 ENCOUNTER — Other Ambulatory Visit: Payer: Commercial Managed Care - HMO

## 2015-02-02 ENCOUNTER — Ambulatory Visit: Payer: Commercial Managed Care - HMO

## 2015-02-10 ENCOUNTER — Other Ambulatory Visit: Payer: Commercial Managed Care - HMO

## 2015-02-15 ENCOUNTER — Ambulatory Visit
Admission: RE | Admit: 2015-02-15 | Discharge: 2015-02-15 | Disposition: A | Discharge status: Home / Self Care | Payer: Commercial Managed Care - HMO | Source: Ambulatory Visit | Attending: Geriatric Medicine | Admitting: Geriatric Medicine

## 2015-02-15 DIAGNOSIS — Z122 Encounter for screening for malignant neoplasm of respiratory organs: Secondary | ICD-10-CM | POA: Diagnosis not present

## 2015-02-15 DIAGNOSIS — E049 Nontoxic goiter, unspecified: Secondary | ICD-10-CM

## 2015-02-15 DIAGNOSIS — E041 Nontoxic single thyroid nodule: Secondary | ICD-10-CM | POA: Diagnosis not present

## 2015-02-15 DIAGNOSIS — Z87891 Personal history of nicotine dependence: Secondary | ICD-10-CM | POA: Diagnosis not present

## 2015-02-15 DIAGNOSIS — R911 Solitary pulmonary nodule: Secondary | ICD-10-CM

## 2015-03-02 ENCOUNTER — Ambulatory Visit
Admission: RE | Admit: 2015-03-02 | Discharge: 2015-03-02 | Disposition: A | Discharge status: Home / Self Care | Payer: Commercial Managed Care - HMO | Source: Ambulatory Visit

## 2015-03-02 DIAGNOSIS — Z1231 Encounter for screening mammogram for malignant neoplasm of breast: Secondary | ICD-10-CM

## 2015-04-14 DIAGNOSIS — M25562 Pain in left knee: Secondary | ICD-10-CM | POA: Diagnosis not present

## 2015-04-14 DIAGNOSIS — M179 Osteoarthritis of knee, unspecified: Secondary | ICD-10-CM | POA: Diagnosis not present

## 2015-04-14 DIAGNOSIS — M25561 Pain in right knee: Secondary | ICD-10-CM | POA: Diagnosis not present

## 2015-04-14 DIAGNOSIS — M25462 Effusion, left knee: Secondary | ICD-10-CM | POA: Diagnosis not present

## 2015-04-14 DIAGNOSIS — G8929 Other chronic pain: Secondary | ICD-10-CM | POA: Diagnosis not present

## 2015-08-15 DIAGNOSIS — M25561 Pain in right knee: Secondary | ICD-10-CM | POA: Diagnosis not present

## 2015-08-15 DIAGNOSIS — G8929 Other chronic pain: Secondary | ICD-10-CM | POA: Diagnosis not present

## 2015-08-15 DIAGNOSIS — M25562 Pain in left knee: Secondary | ICD-10-CM | POA: Diagnosis not present

## 2015-09-27 DIAGNOSIS — M62838 Other muscle spasm: Secondary | ICD-10-CM | POA: Diagnosis not present

## 2015-09-27 DIAGNOSIS — R911 Solitary pulmonary nodule: Secondary | ICD-10-CM | POA: Diagnosis not present

## 2015-09-27 DIAGNOSIS — H539 Unspecified visual disturbance: Secondary | ICD-10-CM | POA: Diagnosis not present

## 2015-09-27 DIAGNOSIS — M353 Polymyalgia rheumatica: Secondary | ICD-10-CM | POA: Diagnosis not present

## 2015-10-24 DIAGNOSIS — M353 Polymyalgia rheumatica: Secondary | ICD-10-CM | POA: Diagnosis not present

## 2015-10-24 DIAGNOSIS — I483 Typical atrial flutter: Secondary | ICD-10-CM | POA: Diagnosis not present

## 2015-10-24 DIAGNOSIS — M85861 Other specified disorders of bone density and structure, right lower leg: Secondary | ICD-10-CM | POA: Diagnosis not present

## 2015-10-24 DIAGNOSIS — F3340 Major depressive disorder, recurrent, in remission, unspecified: Secondary | ICD-10-CM | POA: Diagnosis not present

## 2015-10-24 DIAGNOSIS — Z23 Encounter for immunization: Secondary | ICD-10-CM | POA: Diagnosis not present

## 2015-10-27 DIAGNOSIS — H04123 Dry eye syndrome of bilateral lacrimal glands: Secondary | ICD-10-CM | POA: Diagnosis not present

## 2015-10-27 DIAGNOSIS — H40009 Preglaucoma, unspecified, unspecified eye: Secondary | ICD-10-CM | POA: Diagnosis not present

## 2015-11-09 DIAGNOSIS — J0100 Acute maxillary sinusitis, unspecified: Secondary | ICD-10-CM | POA: Diagnosis not present

## 2016-02-21 ENCOUNTER — Other Ambulatory Visit: Payer: Self-pay | Admitting: Geriatric Medicine

## 2016-02-21 DIAGNOSIS — Z1231 Encounter for screening mammogram for malignant neoplasm of breast: Secondary | ICD-10-CM

## 2016-03-16 ENCOUNTER — Ambulatory Visit
Admission: RE | Admit: 2016-03-16 | Discharge: 2016-03-16 | Disposition: A | Discharge status: Home / Self Care | Payer: Commercial Managed Care - HMO | Source: Ambulatory Visit | Attending: Geriatric Medicine | Admitting: Geriatric Medicine

## 2016-03-16 DIAGNOSIS — Z1231 Encounter for screening mammogram for malignant neoplasm of breast: Secondary | ICD-10-CM | POA: Diagnosis not present

## 2016-03-19 ENCOUNTER — Other Ambulatory Visit: Payer: Self-pay | Admitting: Geriatric Medicine

## 2016-03-19 DIAGNOSIS — R928 Other abnormal and inconclusive findings on diagnostic imaging of breast: Secondary | ICD-10-CM

## 2016-03-22 ENCOUNTER — Other Ambulatory Visit: Payer: Self-pay | Admitting: Geriatric Medicine

## 2016-03-22 ENCOUNTER — Ambulatory Visit
Admission: RE | Admit: 2016-03-22 | Discharge: 2016-03-22 | Disposition: A | Discharge status: Home / Self Care | Payer: Commercial Managed Care - HMO | Source: Ambulatory Visit | Attending: Geriatric Medicine | Admitting: Geriatric Medicine

## 2016-03-22 DIAGNOSIS — R921 Mammographic calcification found on diagnostic imaging of breast: Secondary | ICD-10-CM | POA: Diagnosis not present

## 2016-03-22 DIAGNOSIS — R928 Other abnormal and inconclusive findings on diagnostic imaging of breast: Secondary | ICD-10-CM

## 2016-03-23 ENCOUNTER — Ambulatory Visit
Admission: RE | Admit: 2016-03-23 | Discharge: 2016-03-23 | Disposition: A | Discharge status: Home / Self Care | Payer: Commercial Managed Care - HMO | Source: Ambulatory Visit | Attending: Geriatric Medicine | Admitting: Geriatric Medicine

## 2016-03-23 DIAGNOSIS — R921 Mammographic calcification found on diagnostic imaging of breast: Secondary | ICD-10-CM | POA: Diagnosis not present

## 2016-03-23 DIAGNOSIS — N6012 Diffuse cystic mastopathy of left breast: Secondary | ICD-10-CM | POA: Diagnosis not present

## 2016-04-03 DIAGNOSIS — M545 Low back pain: Secondary | ICD-10-CM | POA: Diagnosis not present

## 2016-04-03 DIAGNOSIS — J0100 Acute maxillary sinusitis, unspecified: Secondary | ICD-10-CM | POA: Diagnosis not present

## 2016-04-09 ENCOUNTER — Other Ambulatory Visit: Payer: Self-pay | Admitting: Geriatric Medicine

## 2016-04-09 DIAGNOSIS — M85851 Other specified disorders of bone density and structure, right thigh: Secondary | ICD-10-CM | POA: Diagnosis not present

## 2016-04-09 DIAGNOSIS — Z1389 Encounter for screening for other disorder: Secondary | ICD-10-CM | POA: Diagnosis not present

## 2016-04-09 DIAGNOSIS — E049 Nontoxic goiter, unspecified: Secondary | ICD-10-CM

## 2016-04-09 DIAGNOSIS — M5432 Sciatica, left side: Secondary | ICD-10-CM | POA: Diagnosis not present

## 2016-04-09 DIAGNOSIS — I700 Atherosclerosis of aorta: Secondary | ICD-10-CM | POA: Diagnosis not present

## 2016-04-09 DIAGNOSIS — R911 Solitary pulmonary nodule: Secondary | ICD-10-CM | POA: Diagnosis not present

## 2016-04-09 DIAGNOSIS — Z79899 Other long term (current) drug therapy: Secondary | ICD-10-CM | POA: Diagnosis not present

## 2016-04-09 DIAGNOSIS — F329 Major depressive disorder, single episode, unspecified: Secondary | ICD-10-CM | POA: Diagnosis not present

## 2016-04-09 DIAGNOSIS — B372 Candidiasis of skin and nail: Secondary | ICD-10-CM | POA: Diagnosis not present

## 2016-04-09 DIAGNOSIS — Z0000 Encounter for general adult medical examination without abnormal findings: Secondary | ICD-10-CM | POA: Diagnosis not present

## 2016-04-09 DIAGNOSIS — M353 Polymyalgia rheumatica: Secondary | ICD-10-CM | POA: Diagnosis not present

## 2016-04-10 ENCOUNTER — Ambulatory Visit
Admission: RE | Admit: 2016-04-10 | Discharge: 2016-04-10 | Disposition: A | Discharge status: Home / Self Care | Payer: Commercial Managed Care - HMO | Source: Ambulatory Visit | Attending: Geriatric Medicine | Admitting: Geriatric Medicine

## 2016-04-10 DIAGNOSIS — E049 Nontoxic goiter, unspecified: Secondary | ICD-10-CM

## 2016-04-10 DIAGNOSIS — E042 Nontoxic multinodular goiter: Secondary | ICD-10-CM | POA: Diagnosis not present

## 2016-04-16 ENCOUNTER — Ambulatory Visit
Admission: RE | Admit: 2016-04-16 | Discharge: 2016-04-16 | Disposition: A | Discharge status: Home / Self Care | Payer: Medicare HMO | Source: Ambulatory Visit | Attending: Geriatric Medicine | Admitting: Geriatric Medicine

## 2016-04-16 DIAGNOSIS — M48061 Spinal stenosis, lumbar region without neurogenic claudication: Secondary | ICD-10-CM | POA: Diagnosis not present

## 2016-04-16 DIAGNOSIS — M5432 Sciatica, left side: Secondary | ICD-10-CM

## 2016-04-24 DIAGNOSIS — Z6835 Body mass index (BMI) 35.0-35.9, adult: Secondary | ICD-10-CM | POA: Diagnosis not present

## 2016-04-24 DIAGNOSIS — M5136 Other intervertebral disc degeneration, lumbar region: Secondary | ICD-10-CM | POA: Diagnosis not present

## 2016-04-24 DIAGNOSIS — M47816 Spondylosis without myelopathy or radiculopathy, lumbar region: Secondary | ICD-10-CM | POA: Diagnosis not present

## 2016-04-24 DIAGNOSIS — M546 Pain in thoracic spine: Secondary | ICD-10-CM | POA: Diagnosis not present

## 2016-04-24 DIAGNOSIS — M549 Dorsalgia, unspecified: Secondary | ICD-10-CM | POA: Diagnosis not present

## 2016-04-24 DIAGNOSIS — R29898 Other symptoms and signs involving the musculoskeletal system: Secondary | ICD-10-CM | POA: Diagnosis not present

## 2016-05-07 DIAGNOSIS — M353 Polymyalgia rheumatica: Secondary | ICD-10-CM | POA: Diagnosis not present

## 2016-05-07 DIAGNOSIS — M5432 Sciatica, left side: Secondary | ICD-10-CM | POA: Diagnosis not present

## 2016-05-09 DIAGNOSIS — M8588 Other specified disorders of bone density and structure, other site: Secondary | ICD-10-CM | POA: Diagnosis not present

## 2016-05-17 DIAGNOSIS — L570 Actinic keratosis: Secondary | ICD-10-CM | POA: Diagnosis not present

## 2016-05-17 DIAGNOSIS — B351 Tinea unguium: Secondary | ICD-10-CM | POA: Diagnosis not present

## 2016-05-17 DIAGNOSIS — D225 Melanocytic nevi of trunk: Secondary | ICD-10-CM | POA: Diagnosis not present

## 2016-05-17 DIAGNOSIS — L821 Other seborrheic keratosis: Secondary | ICD-10-CM | POA: Diagnosis not present

## 2016-05-17 DIAGNOSIS — C44729 Squamous cell carcinoma of skin of left lower limb, including hip: Secondary | ICD-10-CM | POA: Diagnosis not present

## 2016-05-17 DIAGNOSIS — L814 Other melanin hyperpigmentation: Secondary | ICD-10-CM | POA: Diagnosis not present

## 2016-05-18 ENCOUNTER — Other Ambulatory Visit: Payer: Self-pay | Admitting: Dermatology

## 2016-05-18 DIAGNOSIS — D0472 Carcinoma in situ of skin of left lower limb, including hip: Secondary | ICD-10-CM | POA: Diagnosis not present

## 2016-05-21 DIAGNOSIS — M170 Bilateral primary osteoarthritis of knee: Secondary | ICD-10-CM | POA: Diagnosis not present

## 2016-05-21 DIAGNOSIS — G8929 Other chronic pain: Secondary | ICD-10-CM | POA: Diagnosis not present

## 2016-07-05 DIAGNOSIS — Z85828 Personal history of other malignant neoplasm of skin: Secondary | ICD-10-CM | POA: Diagnosis not present

## 2016-07-05 DIAGNOSIS — B351 Tinea unguium: Secondary | ICD-10-CM | POA: Diagnosis not present

## 2016-08-13 DIAGNOSIS — R69 Illness, unspecified: Secondary | ICD-10-CM | POA: Diagnosis not present

## 2016-08-29 DIAGNOSIS — H5213 Myopia, bilateral: Secondary | ICD-10-CM | POA: Diagnosis not present

## 2016-10-03 ENCOUNTER — Encounter (INDEPENDENT_AMBULATORY_CARE_PROVIDER_SITE_OTHER): Payer: Medicare HMO | Admitting: Ophthalmology

## 2016-10-03 DIAGNOSIS — H43813 Vitreous degeneration, bilateral: Secondary | ICD-10-CM | POA: Diagnosis not present

## 2016-10-03 DIAGNOSIS — H35372 Puckering of macula, left eye: Secondary | ICD-10-CM

## 2016-10-03 DIAGNOSIS — G453 Amaurosis fugax: Secondary | ICD-10-CM

## 2016-10-03 DIAGNOSIS — H353131 Nonexudative age-related macular degeneration, bilateral, early dry stage: Secondary | ICD-10-CM

## 2016-10-11 DIAGNOSIS — M170 Bilateral primary osteoarthritis of knee: Secondary | ICD-10-CM | POA: Diagnosis not present

## 2016-12-20 DIAGNOSIS — Z85828 Personal history of other malignant neoplasm of skin: Secondary | ICD-10-CM | POA: Diagnosis not present

## 2016-12-20 DIAGNOSIS — L905 Scar conditions and fibrosis of skin: Secondary | ICD-10-CM | POA: Diagnosis not present

## 2017-02-21 ENCOUNTER — Other Ambulatory Visit: Payer: Self-pay | Admitting: Geriatric Medicine

## 2017-02-21 DIAGNOSIS — Z1231 Encounter for screening mammogram for malignant neoplasm of breast: Secondary | ICD-10-CM

## 2017-02-21 DIAGNOSIS — Z122 Encounter for screening for malignant neoplasm of respiratory organs: Secondary | ICD-10-CM

## 2017-02-26 ENCOUNTER — Ambulatory Visit
Admission: RE | Admit: 2017-02-26 | Discharge: 2017-02-26 | Disposition: A | Discharge status: Home / Self Care | Payer: Medicare HMO | Source: Ambulatory Visit | Attending: Geriatric Medicine | Admitting: Geriatric Medicine

## 2017-02-26 ENCOUNTER — Ambulatory Visit: Payer: Medicare HMO

## 2017-02-26 DIAGNOSIS — G8929 Other chronic pain: Secondary | ICD-10-CM | POA: Diagnosis not present

## 2017-02-26 DIAGNOSIS — Z87891 Personal history of nicotine dependence: Secondary | ICD-10-CM | POA: Diagnosis not present

## 2017-02-26 DIAGNOSIS — Z122 Encounter for screening for malignant neoplasm of respiratory organs: Secondary | ICD-10-CM

## 2017-02-26 DIAGNOSIS — M170 Bilateral primary osteoarthritis of knee: Secondary | ICD-10-CM | POA: Diagnosis not present

## 2017-03-04 ENCOUNTER — Ambulatory Visit: Payer: Medicare HMO | Admitting: Podiatry

## 2017-03-04 DIAGNOSIS — L603 Nail dystrophy: Secondary | ICD-10-CM | POA: Diagnosis not present

## 2017-03-06 NOTE — Progress Notes (Signed)
   Subjective: Patient presents today for possible treatment and evaluation of fungal nails of bilateral great toenails. Patient states that the nails have been discolored and thickened for greater than 1 month. She denies any pain. Patient presents today for further treatment and evaluation.  Past Medical History:  Diagnosis Date  . Allergic rhinitis   . Anxiety   . DDD (degenerative disc disease)    a. L5-S1, MRI 05/2006  . Depression   . Dupuytren's contracture   . HTN (hypertension)   . Obesity   . Osteoarthritis    a. knees  . Palpitations   . Paroxysmal atrial flutter ([place])   . PMR (polymyalgia rheumatica) ([facility])   . Thyroid nodule    a. negative bx 2001 - right lobe  . Vitamin D deficiency     Objective: Physical Exam General: The patient is alert and oriented x3 in no acute distress.  Dermatology: Hyperkeratotic, discolored, thickened, onychodystrophy of great toenails noted bilaterally.  Skin is warm, dry and supple bilateral lower extremities. Negative for open lesions or macerations.  Vascular: Palpable pedal pulses bilaterally. No edema or erythema noted. Capillary refill within normal limits.  Neurological: Epicritic and protective threshold grossly intact bilaterally.   Musculoskeletal Exam: Range of motion within normal limits to all pedal and ankle joints bilateral. Muscle strength 5/5 in all groups bilateral.   Assessment: #1 onychodystrophy bilateral great toenails #2 possible onychomycosis #3 hyperkeratotic nails bilateral  Plan of Care:  #1 Patient was evaluated. #2 Assured patient that her nails are not detrimental to her overall health. #3 Continue wearing good shoes and maintaining good foot hygiene.  #4 Return to clinic as needed.   Edrick Kins, DPM [facility]  Dr. Edrick Kins, [place]                                        [place], [place] [address]                Office [phone]  Fax [phone]

## 2017-03-19 DIAGNOSIS — M9903 Segmental and somatic dysfunction of lumbar region: Secondary | ICD-10-CM | POA: Diagnosis not present

## 2017-03-19 DIAGNOSIS — M4716 Other spondylosis with myelopathy, lumbar region: Secondary | ICD-10-CM | POA: Diagnosis not present

## 2017-03-19 DIAGNOSIS — M5432 Sciatica, left side: Secondary | ICD-10-CM | POA: Diagnosis not present

## 2017-03-19 DIAGNOSIS — M9905 Segmental and somatic dysfunction of pelvic region: Secondary | ICD-10-CM | POA: Diagnosis not present

## 2017-03-21 ENCOUNTER — Ambulatory Visit: Payer: Medicare HMO

## 2017-03-21 DIAGNOSIS — G8929 Other chronic pain: Secondary | ICD-10-CM | POA: Diagnosis not present

## 2017-03-21 DIAGNOSIS — M25562 Pain in left knee: Secondary | ICD-10-CM | POA: Diagnosis not present

## 2017-03-21 DIAGNOSIS — M1712 Unilateral primary osteoarthritis, left knee: Secondary | ICD-10-CM | POA: Diagnosis not present

## 2017-03-25 ENCOUNTER — Other Ambulatory Visit: Payer: Self-pay | Admitting: Orthopedic Surgery

## 2017-03-25 ENCOUNTER — Ambulatory Visit
Admission: RE | Admit: 2017-03-25 | Discharge: 2017-03-25 | Disposition: A | Discharge status: Home / Self Care | Payer: Medicare HMO | Source: Ambulatory Visit | Attending: Orthopedic Surgery | Admitting: Orthopedic Surgery

## 2017-03-25 DIAGNOSIS — R52 Pain, unspecified: Secondary | ICD-10-CM

## 2017-03-25 DIAGNOSIS — M25562 Pain in left knee: Secondary | ICD-10-CM | POA: Diagnosis not present

## 2017-03-28 DIAGNOSIS — S83241A Other tear of medial meniscus, current injury, right knee, initial encounter: Secondary | ICD-10-CM | POA: Diagnosis not present

## 2017-04-09 ENCOUNTER — Ambulatory Visit: Payer: Medicare HMO

## 2017-04-17 DIAGNOSIS — M6752 Plica syndrome, left knee: Secondary | ICD-10-CM | POA: Diagnosis not present

## 2017-04-17 DIAGNOSIS — M94262 Chondromalacia, left knee: Secondary | ICD-10-CM | POA: Diagnosis not present

## 2017-04-17 DIAGNOSIS — G8918 Other acute postprocedural pain: Secondary | ICD-10-CM | POA: Diagnosis not present

## 2017-04-17 DIAGNOSIS — S83222A Peripheral tear of medial meniscus, current injury, left knee, initial encounter: Secondary | ICD-10-CM | POA: Diagnosis not present

## 2017-04-17 DIAGNOSIS — M659 Synovitis and tenosynovitis, unspecified: Secondary | ICD-10-CM | POA: Diagnosis not present

## 2017-04-17 DIAGNOSIS — S83232A Complex tear of medial meniscus, current injury, left knee, initial encounter: Secondary | ICD-10-CM | POA: Diagnosis not present

## 2017-04-30 DIAGNOSIS — M4716 Other spondylosis with myelopathy, lumbar region: Secondary | ICD-10-CM | POA: Diagnosis not present

## 2017-04-30 DIAGNOSIS — M9903 Segmental and somatic dysfunction of lumbar region: Secondary | ICD-10-CM | POA: Diagnosis not present

## 2017-04-30 DIAGNOSIS — M9905 Segmental and somatic dysfunction of pelvic region: Secondary | ICD-10-CM | POA: Diagnosis not present

## 2017-04-30 DIAGNOSIS — M5432 Sciatica, left side: Secondary | ICD-10-CM | POA: Diagnosis not present

## 2017-05-08 DIAGNOSIS — M25562 Pain in left knee: Secondary | ICD-10-CM | POA: Diagnosis not present

## 2017-05-08 DIAGNOSIS — M25662 Stiffness of left knee, not elsewhere classified: Secondary | ICD-10-CM | POA: Diagnosis not present

## 2017-05-13 DIAGNOSIS — I483 Typical atrial flutter: Secondary | ICD-10-CM | POA: Diagnosis not present

## 2017-05-13 DIAGNOSIS — E049 Nontoxic goiter, unspecified: Secondary | ICD-10-CM | POA: Diagnosis not present

## 2017-05-13 DIAGNOSIS — R600 Localized edema: Secondary | ICD-10-CM | POA: Diagnosis not present

## 2017-05-13 DIAGNOSIS — Z0000 Encounter for general adult medical examination without abnormal findings: Secondary | ICD-10-CM | POA: Diagnosis not present

## 2017-05-13 DIAGNOSIS — R911 Solitary pulmonary nodule: Secondary | ICD-10-CM | POA: Diagnosis not present

## 2017-05-13 DIAGNOSIS — F3340 Major depressive disorder, recurrent, in remission, unspecified: Secondary | ICD-10-CM | POA: Diagnosis not present

## 2017-05-13 DIAGNOSIS — I700 Atherosclerosis of aorta: Secondary | ICD-10-CM | POA: Diagnosis not present

## 2017-05-13 DIAGNOSIS — Z79899 Other long term (current) drug therapy: Secondary | ICD-10-CM | POA: Diagnosis not present

## 2017-05-13 DIAGNOSIS — M353 Polymyalgia rheumatica: Secondary | ICD-10-CM | POA: Diagnosis not present

## 2017-05-14 ENCOUNTER — Ambulatory Visit: Payer: Medicare HMO

## 2017-06-03 ENCOUNTER — Ambulatory Visit
Admission: RE | Admit: 2017-06-03 | Discharge: 2017-06-03 | Disposition: A | Discharge status: Home / Self Care | Payer: Medicare HMO | Source: Ambulatory Visit | Attending: Geriatric Medicine | Admitting: Geriatric Medicine

## 2017-06-03 DIAGNOSIS — Z1231 Encounter for screening mammogram for malignant neoplasm of breast: Secondary | ICD-10-CM

## 2017-07-16 DIAGNOSIS — I480 Paroxysmal atrial fibrillation: Secondary | ICD-10-CM | POA: Diagnosis not present

## 2017-07-16 DIAGNOSIS — M353 Polymyalgia rheumatica: Secondary | ICD-10-CM | POA: Diagnosis not present

## 2017-07-23 DIAGNOSIS — G8929 Other chronic pain: Secondary | ICD-10-CM | POA: Diagnosis not present

## 2017-07-23 DIAGNOSIS — M25561 Pain in right knee: Secondary | ICD-10-CM | POA: Diagnosis not present

## 2017-07-23 DIAGNOSIS — M25562 Pain in left knee: Secondary | ICD-10-CM | POA: Diagnosis not present

## 2017-07-24 DIAGNOSIS — M25561 Pain in right knee: Secondary | ICD-10-CM | POA: Diagnosis not present

## 2017-07-24 DIAGNOSIS — M25562 Pain in left knee: Secondary | ICD-10-CM | POA: Diagnosis not present

## 2017-08-27 DIAGNOSIS — H5213 Myopia, bilateral: Secondary | ICD-10-CM | POA: Diagnosis not present

## 2017-09-03 IMAGING — MR MR LUMBAR SPINE W/O CM
4 of 5 series · 25 of 48 positions shown · non-contrast
Comparison: MRI lumbar spine 08/30/2006

CLINICAL DATA: Chronic low back pain with new onset burning and
tingling in both legs 3 weeks ago.

EXAM:
MRI LUMBAR SPINE WITHOUT CONTRAST
TECHNIQUE: Multiplanar, multisequence MR imaging of the lumbar spine was
performed. No intravenous contrast was administered.

[Series 4: T1 · sagittal · 4.0mm · 0.55mm/px · 6 of 13 slices shown (1 of 2)]
[im 1/13]
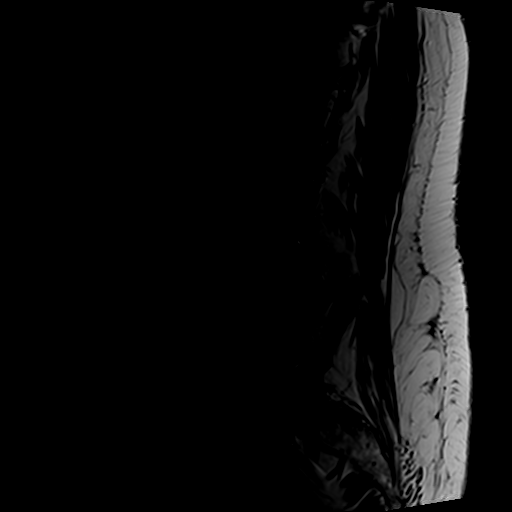
[im 3/13]
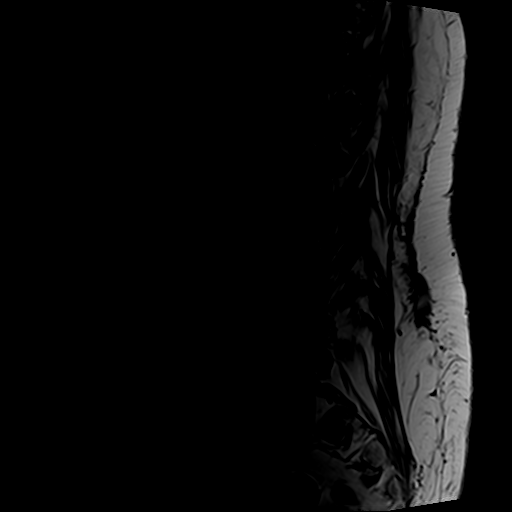
[im 5/13]
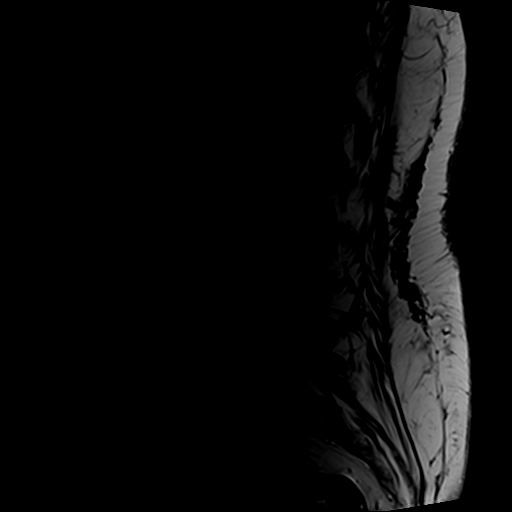
[im 8/13]
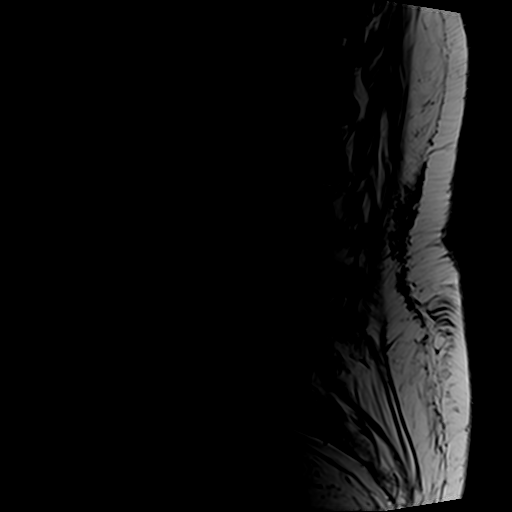
[im 10/13]
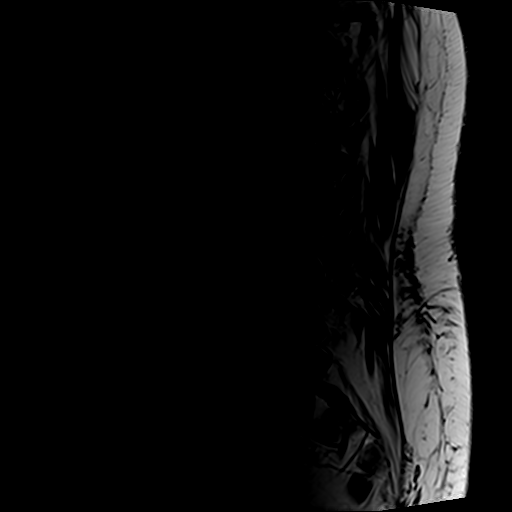
[im 13/13]
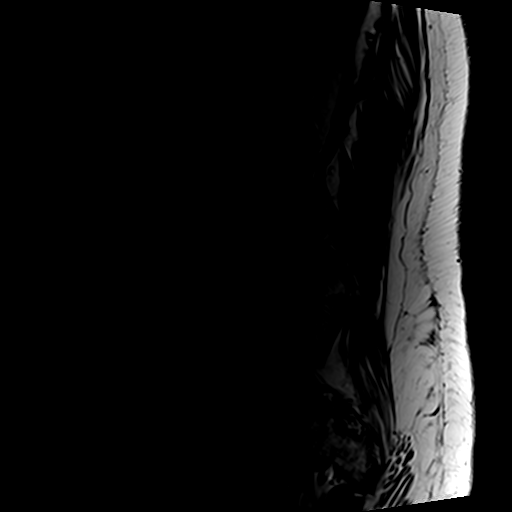

[Series 5: T2 post-contrast · sagittal · 4.0mm · 0.55mm/px · 6 of 13 slices shown]
[im 1/13]
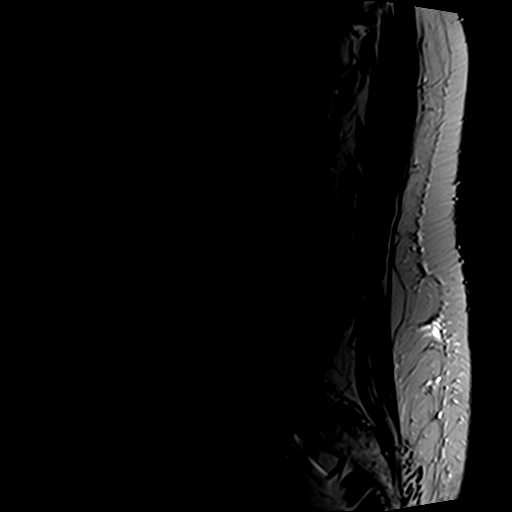
[im 3/13]
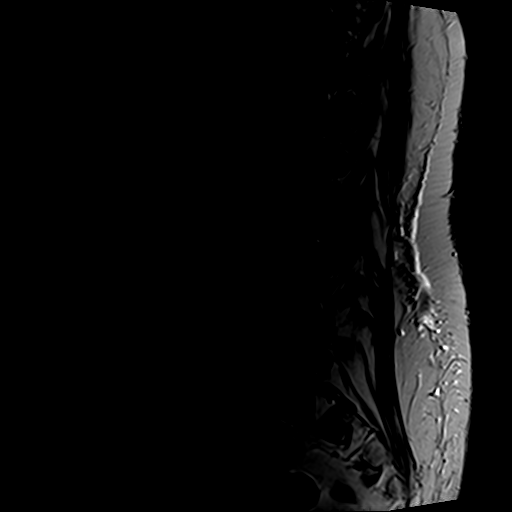
[im 5/13]
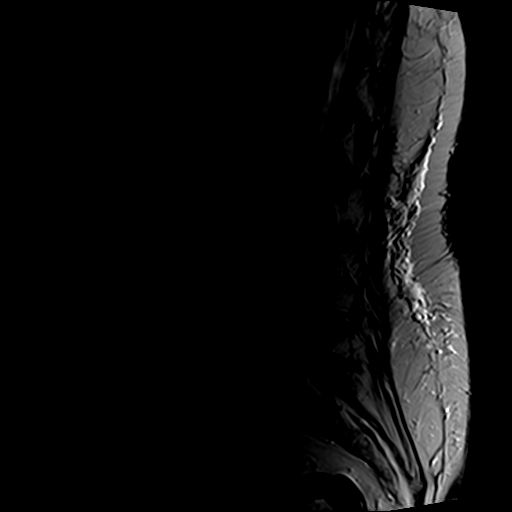
[im 8/13]
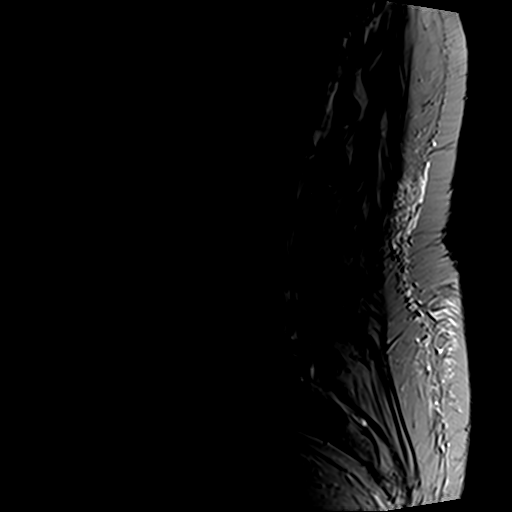
[im 10/13]
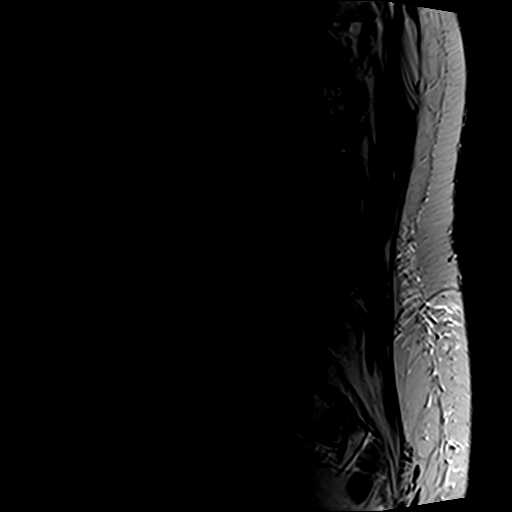
[im 13/13]
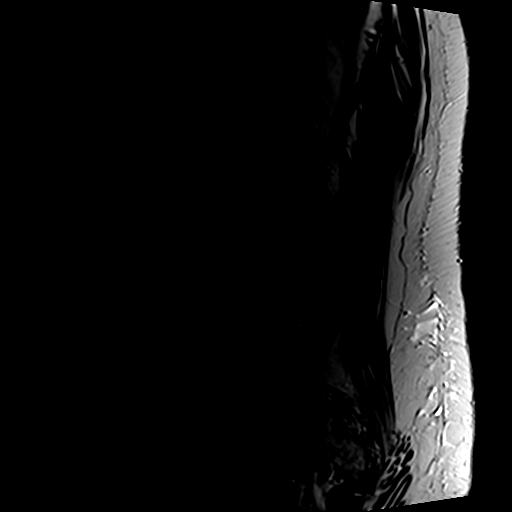

[Series 6: T2 · axial · 4.0mm · 0.70mm/px · z∈[-77,+103]mm · 9 of 33 slices shown]
[im 1/33]
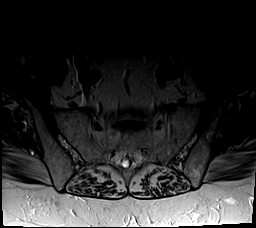
[im 5/33]
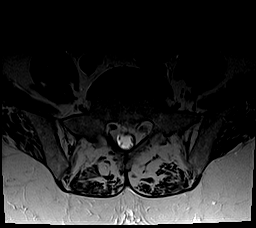
[im 10/33]
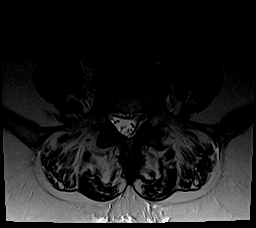
[im 14/33]
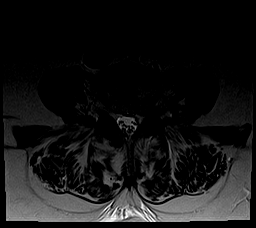
[im 17/33]
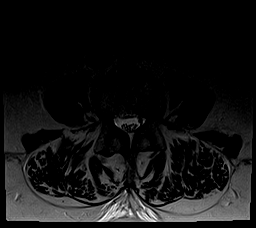
[im 19/33]
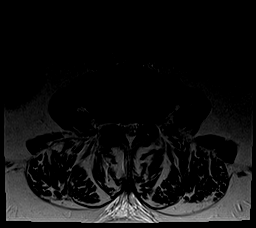
[im 23/33]
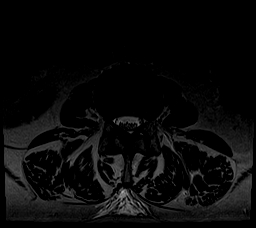
[im 28/33]
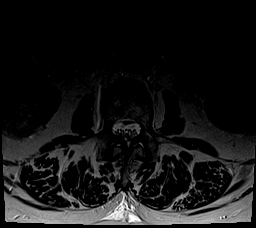
[im 33/33]
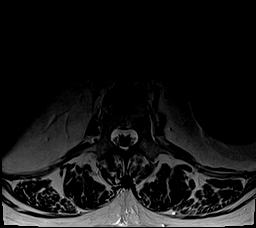

[Series 7: T1 · axial · 4.0mm · 0.35mm/px · z∈[-77,+78]mm · 4 of 33 slices shown (2 of 2)]
[im 1/33]
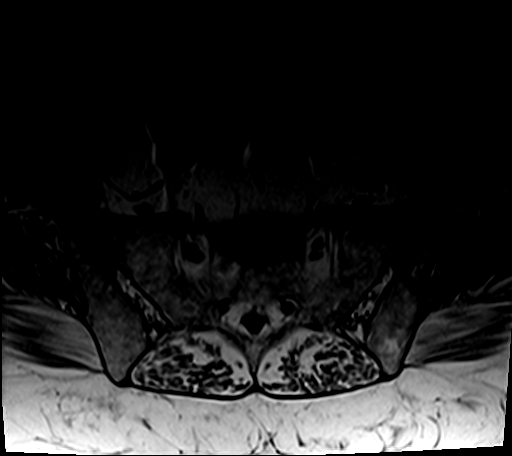
[im 5/33]
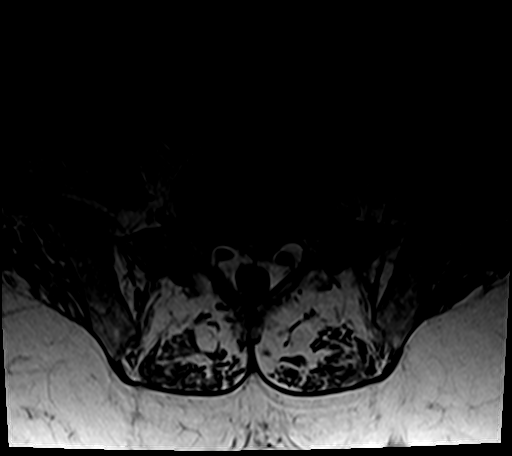
[im 17/33]
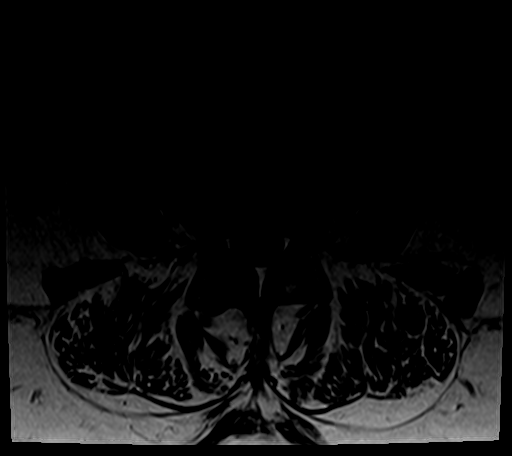
[im 28/33]
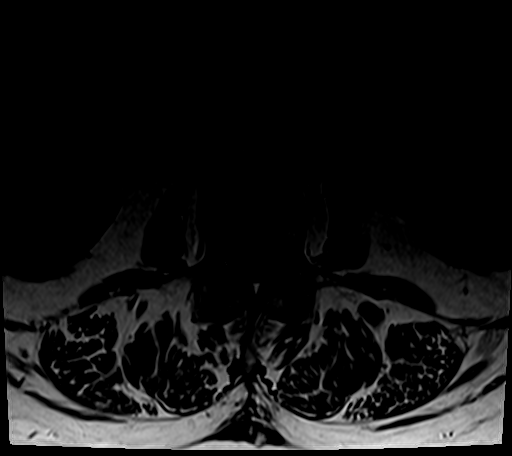

[25 of 48 positions shown; findings below may reference images not displayed]

FINDINGS: Segmentation:  Standard.

Alignment:  Maintained.

Vertebrae:  No fracture or worrisome lesion.

Conus medullaris: Extends to the L1-2 level and appears normal.

Paraspinal and other soft tissues: Negative

Disc levels:

T11-12 and T12-L1 are imaged in the sagittal plane only. Disc
bulging at both levels is more prominent at T11-12 and appears
mildly increased compared to the prior examination. The central
canal and foramina appear open.

L1-2: Shallow broad-based disc bulge with a small down turning
central protrusion. The central spinal canal and foramina are open.

L2-3: Ligamentum flavum thickening and mild facet degenerative
disease are seen. There is a minimal disc bulge. The central canal
and foramina are open.

L3-4: Disc bulge and a new small right subarticular recess
protrusion are seen. There is mild narrowing in the right
subarticular recess. Mild bilateral foraminal narrowing is seen. No
nerve root compression.

L4-5: Shallow broad-based disc bulge and ligamentum flavum
thickening are seen. There is mild central canal stenosis. Disc in
the left foramen causes moderately severe to severe foraminal
narrowing, worse on the than on the prior examination. The right
foramen is open.

L5-S1: Shallow broad-based central protrusion slightly indents the
ventral thecal sac extending into the left foramen causes moderately
severe to severe foraminal stenosis, worse than on the prior exam.
Mild right foraminal narrowing is unchanged.
IMPRESSION: Since the prior examination, moderately severe to severe left
foraminal narrowing due to disc in the foramen at L4-5 is worse than
on the prior examination. Mild central canal stenosis at this level
is unchanged.

Worsened moderately severe to severe left foraminal narrowing at
L5-S1 due to disc since the prior examination. Small central
protrusion at this level is unchanged.

New small right subarticular recess protrusion at L3-4 without nerve
root compression.

## 2017-09-23 DIAGNOSIS — I480 Paroxysmal atrial fibrillation: Secondary | ICD-10-CM | POA: Diagnosis not present

## 2017-09-23 DIAGNOSIS — M353 Polymyalgia rheumatica: Secondary | ICD-10-CM | POA: Diagnosis not present

## 2017-09-23 DIAGNOSIS — Z0000 Encounter for general adult medical examination without abnormal findings: Secondary | ICD-10-CM | POA: Diagnosis not present

## 2017-10-30 DIAGNOSIS — Z1211 Encounter for screening for malignant neoplasm of colon: Secondary | ICD-10-CM | POA: Diagnosis not present

## 2017-10-30 DIAGNOSIS — R112 Nausea with vomiting, unspecified: Secondary | ICD-10-CM | POA: Diagnosis not present

## 2017-10-30 DIAGNOSIS — I480 Paroxysmal atrial fibrillation: Secondary | ICD-10-CM | POA: Diagnosis not present

## 2017-11-13 DIAGNOSIS — G8929 Other chronic pain: Secondary | ICD-10-CM | POA: Diagnosis not present

## 2017-11-13 DIAGNOSIS — M170 Bilateral primary osteoarthritis of knee: Secondary | ICD-10-CM | POA: Diagnosis not present

## 2017-11-28 DIAGNOSIS — K635 Polyp of colon: Secondary | ICD-10-CM | POA: Diagnosis not present

## 2017-11-28 DIAGNOSIS — Z1211 Encounter for screening for malignant neoplasm of colon: Secondary | ICD-10-CM | POA: Diagnosis not present

## 2017-11-28 DIAGNOSIS — K5730 Diverticulosis of large intestine without perforation or abscess without bleeding: Secondary | ICD-10-CM | POA: Diagnosis not present

## 2017-11-28 DIAGNOSIS — K640 First degree hemorrhoids: Secondary | ICD-10-CM | POA: Diagnosis not present

## 2017-12-03 DIAGNOSIS — K635 Polyp of colon: Secondary | ICD-10-CM | POA: Diagnosis not present

## 2017-12-25 DIAGNOSIS — I480 Paroxysmal atrial fibrillation: Secondary | ICD-10-CM | POA: Diagnosis not present

## 2017-12-25 DIAGNOSIS — M353 Polymyalgia rheumatica: Secondary | ICD-10-CM | POA: Diagnosis not present

## 2017-12-25 DIAGNOSIS — Z23 Encounter for immunization: Secondary | ICD-10-CM | POA: Diagnosis not present

## 2018-01-16 DIAGNOSIS — L570 Actinic keratosis: Secondary | ICD-10-CM | POA: Diagnosis not present

## 2018-01-16 DIAGNOSIS — D1801 Hemangioma of skin and subcutaneous tissue: Secondary | ICD-10-CM | POA: Diagnosis not present

## 2018-01-16 DIAGNOSIS — Z85828 Personal history of other malignant neoplasm of skin: Secondary | ICD-10-CM | POA: Diagnosis not present

## 2018-01-16 DIAGNOSIS — C44529 Squamous cell carcinoma of skin of other part of trunk: Secondary | ICD-10-CM | POA: Diagnosis not present

## 2018-01-16 DIAGNOSIS — L814 Other melanin hyperpigmentation: Secondary | ICD-10-CM | POA: Diagnosis not present

## 2018-01-16 DIAGNOSIS — L821 Other seborrheic keratosis: Secondary | ICD-10-CM | POA: Diagnosis not present

## 2018-01-16 DIAGNOSIS — D225 Melanocytic nevi of trunk: Secondary | ICD-10-CM | POA: Diagnosis not present

## 2018-02-11 DIAGNOSIS — L080 Pyoderma: Secondary | ICD-10-CM | POA: Diagnosis not present

## 2018-02-11 DIAGNOSIS — C44529 Squamous cell carcinoma of skin of other part of trunk: Secondary | ICD-10-CM | POA: Diagnosis not present

## 2018-02-28 DIAGNOSIS — C44521 Squamous cell carcinoma of skin of breast: Secondary | ICD-10-CM | POA: Diagnosis not present

## 2018-03-04 DIAGNOSIS — D692 Other nonthrombocytopenic purpura: Secondary | ICD-10-CM | POA: Diagnosis not present

## 2018-03-04 DIAGNOSIS — L989 Disorder of the skin and subcutaneous tissue, unspecified: Secondary | ICD-10-CM | POA: Diagnosis not present

## 2018-03-04 DIAGNOSIS — I700 Atherosclerosis of aorta: Secondary | ICD-10-CM | POA: Diagnosis not present

## 2018-03-04 DIAGNOSIS — M353 Polymyalgia rheumatica: Secondary | ICD-10-CM | POA: Diagnosis not present

## 2018-03-04 DIAGNOSIS — I480 Paroxysmal atrial fibrillation: Secondary | ICD-10-CM | POA: Diagnosis not present

## 2018-03-05 DIAGNOSIS — C44521 Squamous cell carcinoma of skin of breast: Secondary | ICD-10-CM | POA: Diagnosis not present

## 2018-03-12 ENCOUNTER — Other Ambulatory Visit: Payer: Self-pay | Admitting: Geriatric Medicine

## 2018-03-12 DIAGNOSIS — M25562 Pain in left knee: Secondary | ICD-10-CM | POA: Diagnosis not present

## 2018-03-12 DIAGNOSIS — R911 Solitary pulmonary nodule: Secondary | ICD-10-CM

## 2018-03-12 DIAGNOSIS — G8929 Other chronic pain: Secondary | ICD-10-CM | POA: Diagnosis not present

## 2018-03-12 DIAGNOSIS — M25561 Pain in right knee: Secondary | ICD-10-CM | POA: Diagnosis not present

## 2018-03-17 ENCOUNTER — Other Ambulatory Visit: Payer: Self-pay | Admitting: Geriatric Medicine

## 2018-03-17 DIAGNOSIS — R911 Solitary pulmonary nodule: Secondary | ICD-10-CM

## 2018-03-17 DIAGNOSIS — R531 Weakness: Secondary | ICD-10-CM | POA: Diagnosis not present

## 2018-03-17 DIAGNOSIS — J069 Acute upper respiratory infection, unspecified: Secondary | ICD-10-CM | POA: Diagnosis not present

## 2018-03-17 DIAGNOSIS — R300 Dysuria: Secondary | ICD-10-CM | POA: Diagnosis not present

## 2018-03-17 DIAGNOSIS — D72829 Elevated white blood cell count, unspecified: Secondary | ICD-10-CM | POA: Diagnosis not present

## 2018-03-18 ENCOUNTER — Inpatient Hospital Stay: Admission: RE | Admit: 2018-03-18 | Payer: Medicare HMO | Source: Ambulatory Visit

## 2018-03-18 ENCOUNTER — Other Ambulatory Visit: Payer: Medicare HMO

## 2018-04-25 ENCOUNTER — Other Ambulatory Visit: Payer: Self-pay | Admitting: Geriatric Medicine

## 2018-04-25 DIAGNOSIS — Z1231 Encounter for screening mammogram for malignant neoplasm of breast: Secondary | ICD-10-CM

## 2018-04-28 ENCOUNTER — Ambulatory Visit: Payer: Medicare HMO

## 2018-06-09 ENCOUNTER — Ambulatory Visit: Payer: Medicare HMO

## 2018-06-10 DIAGNOSIS — F3340 Major depressive disorder, recurrent, in remission, unspecified: Secondary | ICD-10-CM | POA: Diagnosis not present

## 2018-06-10 DIAGNOSIS — I700 Atherosclerosis of aorta: Secondary | ICD-10-CM | POA: Diagnosis not present

## 2018-06-10 DIAGNOSIS — Z0000 Encounter for general adult medical examination without abnormal findings: Secondary | ICD-10-CM | POA: Diagnosis not present

## 2018-06-10 DIAGNOSIS — M353 Polymyalgia rheumatica: Secondary | ICD-10-CM | POA: Diagnosis not present

## 2018-06-10 DIAGNOSIS — I480 Paroxysmal atrial fibrillation: Secondary | ICD-10-CM | POA: Diagnosis not present

## 2018-07-16 DIAGNOSIS — M25562 Pain in left knee: Secondary | ICD-10-CM | POA: Diagnosis not present

## 2018-07-16 DIAGNOSIS — M25561 Pain in right knee: Secondary | ICD-10-CM | POA: Diagnosis not present

## 2018-07-16 DIAGNOSIS — G8929 Other chronic pain: Secondary | ICD-10-CM | POA: Diagnosis not present

## 2018-07-17 DIAGNOSIS — M25561 Pain in right knee: Secondary | ICD-10-CM | POA: Diagnosis not present

## 2018-07-17 DIAGNOSIS — M25562 Pain in left knee: Secondary | ICD-10-CM | POA: Diagnosis not present

## 2018-07-17 DIAGNOSIS — G8929 Other chronic pain: Secondary | ICD-10-CM | POA: Diagnosis not present

## 2018-07-26 DIAGNOSIS — M25511 Pain in right shoulder: Secondary | ICD-10-CM | POA: Diagnosis not present

## 2018-08-04 ENCOUNTER — Ambulatory Visit
Admission: RE | Admit: 2018-08-04 | Discharge: 2018-08-04 | Disposition: A | Discharge status: Home / Self Care | Payer: Medicare HMO | Source: Ambulatory Visit | Attending: Geriatric Medicine | Admitting: Geriatric Medicine

## 2018-08-04 ENCOUNTER — Other Ambulatory Visit: Payer: Self-pay

## 2018-08-04 DIAGNOSIS — Z1231 Encounter for screening mammogram for malignant neoplasm of breast: Secondary | ICD-10-CM | POA: Diagnosis not present

## 2018-08-06 DIAGNOSIS — M25511 Pain in right shoulder: Secondary | ICD-10-CM | POA: Diagnosis not present

## 2018-12-08 ENCOUNTER — Other Ambulatory Visit: Payer: Self-pay | Admitting: Geriatric Medicine

## 2018-12-08 DIAGNOSIS — M25511 Pain in right shoulder: Secondary | ICD-10-CM | POA: Diagnosis not present

## 2018-12-08 DIAGNOSIS — I480 Paroxysmal atrial fibrillation: Secondary | ICD-10-CM | POA: Diagnosis not present

## 2018-12-08 DIAGNOSIS — G4489 Other headache syndrome: Secondary | ICD-10-CM | POA: Diagnosis not present

## 2018-12-08 DIAGNOSIS — F17211 Nicotine dependence, cigarettes, in remission: Secondary | ICD-10-CM

## 2018-12-08 DIAGNOSIS — R911 Solitary pulmonary nodule: Secondary | ICD-10-CM

## 2018-12-08 DIAGNOSIS — M353 Polymyalgia rheumatica: Secondary | ICD-10-CM | POA: Diagnosis not present

## 2018-12-08 DIAGNOSIS — Z23 Encounter for immunization: Secondary | ICD-10-CM | POA: Diagnosis not present

## 2018-12-08 DIAGNOSIS — Z79899 Other long term (current) drug therapy: Secondary | ICD-10-CM | POA: Diagnosis not present

## 2018-12-15 ENCOUNTER — Ambulatory Visit: Payer: Medicare HMO

## 2018-12-30 ENCOUNTER — Ambulatory Visit
Admission: RE | Admit: 2018-12-30 | Discharge: 2018-12-30 | Disposition: A | Discharge status: Home / Self Care | Payer: Medicare HMO | Source: Ambulatory Visit | Attending: Geriatric Medicine | Admitting: Geriatric Medicine

## 2018-12-30 DIAGNOSIS — R911 Solitary pulmonary nodule: Secondary | ICD-10-CM

## 2018-12-30 DIAGNOSIS — M25562 Pain in left knee: Secondary | ICD-10-CM | POA: Diagnosis not present

## 2018-12-30 DIAGNOSIS — F17211 Nicotine dependence, cigarettes, in remission: Secondary | ICD-10-CM

## 2018-12-30 DIAGNOSIS — G8929 Other chronic pain: Secondary | ICD-10-CM | POA: Diagnosis not present

## 2018-12-30 DIAGNOSIS — Z87891 Personal history of nicotine dependence: Secondary | ICD-10-CM | POA: Diagnosis not present

## 2018-12-30 DIAGNOSIS — M25561 Pain in right knee: Secondary | ICD-10-CM | POA: Diagnosis not present

## 2019-05-20 DIAGNOSIS — L814 Other melanin hyperpigmentation: Secondary | ICD-10-CM | POA: Diagnosis not present

## 2019-05-20 DIAGNOSIS — L905 Scar conditions and fibrosis of skin: Secondary | ICD-10-CM | POA: Diagnosis not present

## 2019-05-20 DIAGNOSIS — L821 Other seborrheic keratosis: Secondary | ICD-10-CM | POA: Diagnosis not present

## 2019-05-20 DIAGNOSIS — L738 Other specified follicular disorders: Secondary | ICD-10-CM | POA: Diagnosis not present

## 2019-05-20 DIAGNOSIS — Z85828 Personal history of other malignant neoplasm of skin: Secondary | ICD-10-CM | POA: Diagnosis not present

## 2019-05-20 DIAGNOSIS — D229 Melanocytic nevi, unspecified: Secondary | ICD-10-CM | POA: Diagnosis not present

## 2019-06-17 DIAGNOSIS — I700 Atherosclerosis of aorta: Secondary | ICD-10-CM | POA: Diagnosis not present

## 2019-06-17 DIAGNOSIS — L729 Follicular cyst of the skin and subcutaneous tissue, unspecified: Secondary | ICD-10-CM | POA: Diagnosis not present

## 2019-06-17 DIAGNOSIS — F3340 Major depressive disorder, recurrent, in remission, unspecified: Secondary | ICD-10-CM | POA: Diagnosis not present

## 2019-06-17 DIAGNOSIS — Z79899 Other long term (current) drug therapy: Secondary | ICD-10-CM | POA: Diagnosis not present

## 2019-06-17 DIAGNOSIS — Z0000 Encounter for general adult medical examination without abnormal findings: Secondary | ICD-10-CM | POA: Diagnosis not present

## 2019-06-17 DIAGNOSIS — I495 Sick sinus syndrome: Secondary | ICD-10-CM | POA: Diagnosis not present

## 2019-06-17 DIAGNOSIS — E049 Nontoxic goiter, unspecified: Secondary | ICD-10-CM | POA: Diagnosis not present

## 2019-06-17 DIAGNOSIS — I480 Paroxysmal atrial fibrillation: Secondary | ICD-10-CM | POA: Diagnosis not present

## 2019-06-30 ENCOUNTER — Other Ambulatory Visit: Payer: Self-pay | Admitting: Geriatric Medicine

## 2019-06-30 DIAGNOSIS — Z1231 Encounter for screening mammogram for malignant neoplasm of breast: Secondary | ICD-10-CM

## 2019-07-20 DIAGNOSIS — I480 Paroxysmal atrial fibrillation: Secondary | ICD-10-CM | POA: Diagnosis not present

## 2019-07-30 DIAGNOSIS — H5213 Myopia, bilateral: Secondary | ICD-10-CM | POA: Diagnosis not present

## 2019-08-05 ENCOUNTER — Ambulatory Visit
Admission: RE | Admit: 2019-08-05 | Discharge: 2019-08-05 | Disposition: A | Discharge status: Home / Self Care | Payer: Medicare HMO | Source: Ambulatory Visit | Attending: Geriatric Medicine | Admitting: Geriatric Medicine

## 2019-08-05 ENCOUNTER — Other Ambulatory Visit: Payer: Self-pay

## 2019-08-05 DIAGNOSIS — Z1231 Encounter for screening mammogram for malignant neoplasm of breast: Secondary | ICD-10-CM | POA: Diagnosis not present

## 2019-08-05 DIAGNOSIS — M170 Bilateral primary osteoarthritis of knee: Secondary | ICD-10-CM | POA: Diagnosis not present

## 2019-08-06 DIAGNOSIS — I495 Sick sinus syndrome: Secondary | ICD-10-CM | POA: Diagnosis not present

## 2019-09-08 ENCOUNTER — Ambulatory Visit (INDEPENDENT_AMBULATORY_CARE_PROVIDER_SITE_OTHER): Payer: Medicare HMO | Admitting: Internal Medicine

## 2019-09-08 ENCOUNTER — Other Ambulatory Visit: Payer: Self-pay

## 2019-09-08 ENCOUNTER — Encounter: Payer: Self-pay | Admitting: *Deleted

## 2019-09-08 VITALS — BP 124/80 | HR 110 | Ht 62.0 in | Wt 162.0 lb

## 2019-09-08 DIAGNOSIS — I4892 Unspecified atrial flutter: Secondary | ICD-10-CM | POA: Diagnosis not present

## 2019-09-08 DIAGNOSIS — I495 Sick sinus syndrome: Secondary | ICD-10-CM | POA: Diagnosis not present

## 2019-09-08 LAB — BASIC METABOLIC PANEL
BUN/Creatinine Ratio: 19 (ref 12–28)
BUN: 13 mg/dL (ref 8–27)
CO2: 23 mmol/L (ref 20–29)
Calcium: 8.9 mg/dL (ref 8.7–10.3)
Chloride: 103 mmol/L (ref 96–106)
Creatinine, Ser: 0.68 mg/dL (ref 0.57–1.00)
GFR calc Af Amer: 100 mL/min/{1.73_m2} (ref >59)
GFR calc non Af Amer: 87 mL/min/{1.73_m2} (ref >59)
Glucose: 96 mg/dL (ref 65–99)
Potassium: 4 mmol/L (ref 3.5–5.2)
Sodium: 142 mmol/L (ref 134–144)

## 2019-09-08 LAB — CBC WITH DIFFERENTIAL/PLATELET
Basophils Absolute: 0 10*3/uL (ref 0.0–0.2)
Basos: 0 %
EOS (ABSOLUTE): 0.1 10*3/uL (ref 0.0–0.4)
Eos: 1 %
Hematocrit: 45 % (ref 34.0–46.6)
Hemoglobin: 16 g/dL — ABNORMAL HIGH (ref 11.1–15.9)
Immature Grans (Abs): 0 10*3/uL (ref 0.0–0.1)
Immature Granulocytes: 0 %
Lymphocytes Absolute: 2 10*3/uL (ref 0.7–3.1)
Lymphs: 22 %
MCH: 35.4 pg — ABNORMAL HIGH (ref 26.6–33.0)
MCHC: 35.6 g/dL (ref 31.5–35.7)
MCV: 100 fL — ABNORMAL HIGH (ref 79–97)
Monocytes Absolute: 0.8 10*3/uL (ref 0.1–0.9)
Monocytes: 9 %
Neutrophils Absolute: 6.2 10*3/uL (ref 1.4–7.0)
Neutrophils: 68 %
Platelets: 181 10*3/uL (ref 150–450)
RBC: 4.52 x10E6/uL (ref 3.77–5.28)
RDW: 12.2 % (ref 11.7–15.4)
WBC: 9.2 10*3/uL (ref 3.4–10.8)

## 2019-09-08 NOTE — H&P (View-Only) (Signed)
HPI [name] returns today after a 9 year absence from our EP clinic. She has a h/o atrial fib and flutter. I offered her an ablation 9 years ago. She was lost to followup. She has continued to have symptomatic atrial fib and notes worsening fatigue and dyspnea with exertion. She denies HTN. She wore a cardiac monitor and was found to have an ave HR in the 80's but a peak in the 170's and a low in the 40's which was during the daytime. The patient is not on systemic anti-coagulation. She states that her bp at home averages 120.  No Known Allergies   Current Outpatient Medications  Medication Sig Dispense Refill  . alendronate (FOSAMAX) 70 MG tablet Take 70 mg by mouth once a week.     . ALPRAZolam (XANAX) 0.5 MG tablet as needed.    . B Complex Vitamins (VITAMIN B COMPLEX PO) Take by mouth daily.    . Calcium Carbonate-Vitamin D (CALCIUM 600 + D PO) Take by mouth 2 (two) times daily.    Marland Kitchen CARTIA XT 240 MG 24 hr capsule Take 240 mg by mouth daily.     Marland Kitchen loratadine (CLARITIN) 10 MG tablet Take 10 mg by mouth daily.    Marland Kitchen LYSINE PO Take by mouth daily.    . predniSONE (DELTASONE) 10 MG tablet Take 1 tablet by mouth as needed. inflammation    . Probiotic Product (PROBIOTIC PO) Take by mouth.    . valACYclovir (VALTREX) 1000 MG tablet      No current facility-administered medications for this visit.     Past Medical History:  Diagnosis Date  . Allergic rhinitis   . Anxiety   . ATRIAL FLUTTER 05/04/2009   Qualifier: Diagnosis of  By: Lovena Le, MD, Martyn Malay   . DDD (degenerative disc disease)    a. L5-S1, MRI 05/2006  . Depression   . Dupuytren's contracture   . Essential hypertension, benign 05/04/2009   Qualifier: Diagnosis of  By: Lovena Le, MD, Martyn Malay   . HTN (hypertension)   . Left knee pain 02/08/2014  . Obesity   . Osteoarthritis    a. knees  . Palpitations   . Paroxysmal atrial flutter ([place])   . PMR (polymyalgia rheumatica) ([facility])   . Rupture  of anterior cruciate ligament of left knee 02/08/2014  . SUPRAVENTRICULAR TACHYCARDIA 05/03/2009   Qualifier: Diagnosis of  By: Selena Batten CMA, Jewel    . Thyroid nodule    a. negative bx 2001 - right lobe  . Vitamin D deficiency     ROS:   All systems reviewed and negative except as noted in the HPI.   Past Surgical History:  Procedure Laterality Date  . ABDOMINAL HYSTERECTOMY    . BREAST EXCISIONAL BIOPSY Left 1982  . CESAREAN SECTION    . KNEE SURGERY    . NISSEN FUNDOPLICATION       No family history on file.   Social History   Socioeconomic History  . Marital status: Married    Spouse name: Not on file  . Number of children: Not on file  . Years of education: Not on file  . Highest education level: Not on file  Occupational History  . Not on file  Tobacco Use  . Smoking status: Current Every Day Smoker    Types: Cigarettes  Substance and Sexual Activity  . Alcohol use: Not on file  . Drug use: Not on file  . Sexual activity:  Not on file  Other Topics Concern  . Not on file  Social History Narrative  . Not on file   Social Determinants of Health   Financial Resource Strain:   . Difficulty of Paying Living Expenses:   Food Insecurity:   . Worried About Charity fundraiser in the Last Year:   . Arboriculturist in the Last Year:   Transportation Needs:   . Film/video editor (Medical):   Marland Kitchen Lack of Transportation (Non-Medical):   Physical Activity:   . Days of Exercise per Week:   . Minutes of Exercise per Session:   Stress:   . Feeling of Stress :   Social Connections:   . Frequency of Communication with Friends and Family:   . Frequency of Social Gatherings with Friends and Family:   . Attends Religious Services:   . Active Member of Clubs or Organizations:   . Attends Archivist Meetings:   Marland Kitchen Marital Status:   Intimate Partner Violence:   . Fear of Current or Ex-Partner:   . Emotionally Abused:   Marland Kitchen Physically Abused:   . Sexually  Abused:      BP 124/80   Pulse (!) 110   Ht 5\' 2"  (1.575 m)   Wt 162 lb (73.5 kg)   SpO2 93%   BMI 29.63 kg/m   Physical Exam:  Well appearing 74yo woman, NAD HEENT: Unremarkable Neck:  6 cm JVD, no thyromegally Lymphatics:  No adenopathy Back:  No CVA tenderness Lungs:  Clear with no wheezes HEART:  IRegular rate rhythm, no murmurs, no rubs, no clicks Abd:  soft, positive bowel sounds, no organomegally, no rebound, no guarding Ext:  2 plus pulses, no edema, no cyanosis, no clubbing Skin:  No rashes no nodules Neuro:  CN II through XII intact, motor grossly intact  EKG - atrial fib with a RVR  Assess/Plan: 1. Persistent/permanent atrial fib - her rates are not well controlled. She also goes slow at times. I would like to uptitrate her AV nodal blocking drugs but am concerned about bradycardia. We discussed possible treatment options from initiation of an AA drug to PPM insertion and uptitration of her AV nodal blocking drugs which would be her simplest option. She will let us know how she would like to proceed.  2. Coags - the patient denies a diagnosis of HTN. Her CHADSVASC score would be 2 but one of those being for being female. We will hold off on systemic anti-coagulation until she turns 76 or clearly develops HTN.   Mikle Bosworth.D.

## 2019-09-08 NOTE — Progress Notes (Signed)
HPI [name] returns today after a 9 year absence from our EP clinic. She has a h/o atrial fib and flutter. I offered her an ablation 9 years ago. She was lost to followup. She has continued to have symptomatic atrial fib and notes worsening fatigue and dyspnea with exertion. She denies HTN. She wore a cardiac monitor and was found to have an ave HR in the 80's but a peak in the 170's and a low in the 40's which was during the daytime. The patient is not on systemic anti-coagulation. She states that her bp at home averages 120.  No Known Allergies   Current Outpatient Medications  Medication Sig Dispense Refill  . alendronate (FOSAMAX) 70 MG tablet Take 70 mg by mouth once a week.     . ALPRAZolam (XANAX) 0.5 MG tablet as needed.    . B Complex Vitamins (VITAMIN B COMPLEX PO) Take by mouth daily.    . Calcium Carbonate-Vitamin D (CALCIUM 600 + D PO) Take by mouth 2 (two) times daily.    Marland Kitchen CARTIA XT 240 MG 24 hr capsule Take 240 mg by mouth daily.     Marland Kitchen loratadine (CLARITIN) 10 MG tablet Take 10 mg by mouth daily.    Marland Kitchen LYSINE PO Take by mouth daily.    . predniSONE (DELTASONE) 10 MG tablet Take 1 tablet by mouth as needed. inflammation    . Probiotic Product (PROBIOTIC PO) Take by mouth.    . valACYclovir (VALTREX) 1000 MG tablet      No current facility-administered medications for this visit.     Past Medical History:  Diagnosis Date  . Allergic rhinitis   . Anxiety   . ATRIAL FLUTTER 05/04/2009   Qualifier: Diagnosis of  By: Lovena Le, MD, Martyn Malay   . DDD (degenerative disc disease)    a. L5-S1, MRI 05/2006  . Depression   . Dupuytren's contracture   . Essential hypertension, benign 05/04/2009   Qualifier: Diagnosis of  By: Lovena Le, MD, Martyn Malay   . HTN (hypertension)   . Left knee pain 02/08/2014  . Obesity   . Osteoarthritis    a. knees  . Palpitations   . Paroxysmal atrial flutter ([place])   . PMR (polymyalgia rheumatica) (HCC)   . Rupture  of anterior cruciate ligament of left knee 02/08/2014  . SUPRAVENTRICULAR TACHYCARDIA 05/03/2009   Qualifier: Diagnosis of  By: Selena Batten CMA, Jewel    . Thyroid nodule    a. negative bx 2001 - right lobe  . Vitamin D deficiency     ROS:   All systems reviewed and negative except as noted in the HPI.   Past Surgical History:  Procedure Laterality Date  . ABDOMINAL HYSTERECTOMY    . BREAST EXCISIONAL BIOPSY Left 1982  . CESAREAN SECTION    . KNEE SURGERY    . NISSEN FUNDOPLICATION       No family history on file.   Social History   Socioeconomic History  . Marital status: Married    Spouse name: Not on file  . Number of children: Not on file  . Years of education: Not on file  . Highest education level: Not on file  Occupational History  . Not on file  Tobacco Use  . Smoking status: Current Every Day Smoker    Types: Cigarettes  Substance and Sexual Activity  . Alcohol use: Not on file  . Drug use: Not on file  . Sexual activity:  Not on file  Other Topics Concern  . Not on file  Social History Narrative  . Not on file   Social Determinants of Health   Financial Resource Strain:   . Difficulty of Paying Living Expenses:   Food Insecurity:   . Worried About Charity fundraiser in the Last Year:   . Arboriculturist in the Last Year:   Transportation Needs:   . Film/video editor (Medical):   Marland Kitchen Lack of Transportation (Non-Medical):   Physical Activity:   . Days of Exercise per Week:   . Minutes of Exercise per Session:   Stress:   . Feeling of Stress :   Social Connections:   . Frequency of Communication with Friends and Family:   . Frequency of Social Gatherings with Friends and Family:   . Attends Religious Services:   . Active Member of Clubs or Organizations:   . Attends Archivist Meetings:   Marland Kitchen Marital Status:   Intimate Partner Violence:   . Fear of Current or Ex-Partner:   . Emotionally Abused:   Marland Kitchen Physically Abused:   . Sexually  Abused:      BP 124/80   Pulse (!) 110   Ht 5\' 2"  (1.575 m)   Wt 162 lb (73.5 kg)   SpO2 93%   BMI 29.63 kg/m   Physical Exam:  Well appearing 74yo woman, NAD HEENT: Unremarkable Neck:  6 cm JVD, no thyromegally Lymphatics:  No adenopathy Back:  No CVA tenderness Lungs:  Clear with no wheezes HEART:  IRegular rate rhythm, no murmurs, no rubs, no clicks Abd:  soft, positive bowel sounds, no organomegally, no rebound, no guarding Ext:  2 plus pulses, no edema, no cyanosis, no clubbing Skin:  No rashes no nodules Neuro:  CN II through XII intact, motor grossly intact  EKG - atrial fib with a RVR  Assess/Plan: 1. Persistent/permanent atrial fib - her rates are not well controlled. She also goes slow at times. I would like to uptitrate her AV nodal blocking drugs but am concerned about bradycardia. We discussed possible treatment options from initiation of an AA drug to PPM insertion and uptitration of her AV nodal blocking drugs which would be her simplest option. She will let us know how she would like to proceed.  2. Coags - the patient denies a diagnosis of HTN. Her CHADSVASC score would be 2 but one of those being for being female. We will hold off on systemic anti-coagulation until she turns 76 or clearly develops HTN.   Mikle Bosworth.D.

## 2019-09-08 NOTE — Patient Instructions (Addendum)
Medication Instructions:  Your physician recommends that you continue on your current medications as directed. Please refer to the Current Medication list given to you today.  *If you need a refill on your cardiac medications before your next appointment, please call your pharmacy*  Lab Work: Today  BMP, CBC  If you have labs (blood work) drawn today and your tests are completely normal, you will receive your results only by: Marland Kitchen MyChart Message (if you have MyChart) OR . A paper copy in the mail If you have any lab test that is abnormal or we need to change your treatment, we will call you to review the results.  Testing/Procedures: Pacemaker     Other Instructions: see letter   Pacemaker Implantation, Adult Pacemaker implantation is a procedure to place a pacemaker inside your chest. A pacemaker is a small computer that sends electrical signals to the heart and helps your heart beat normally. A pacemaker also stores information about your heart rhythms. You may need pacemaker implantation if you:  Have a slow heartbeat (bradycardia).  Faint (syncope).  Have shortness of breath (dyspnea) due to heart problems. The pacemaker attaches to your heart through a wire, called a lead. Sometimes just one lead is needed. Other times, there will be two leads. There are two types of pacemakers:  Transvenous pacemaker. This type is placed under the skin or muscle of your chest. The lead goes through a vein in the chest area to reach the inside of the heart.  Epicardial pacemaker. This type is placed under the skin or muscle of your chest or belly. The lead goes through your chest to the outside of the heart. Tell a health care provider about:  Any allergies you have.  All medicines you are taking, including vitamins, herbs, eye drops, creams, and over-the-counter medicines.  Any problems you or family members have had with anesthetic medicines.  Any blood or bone disorders you  have.  Any surgeries you have had.  Any medical conditions you have.  Whether you are pregnant or may be pregnant. What are the risks? Generally, this is a safe procedure. However, problems may occur, including:  Infection.  Bleeding.  Failure of the pacemaker or the lead.  Collapse of a lung or bleeding into a lung.  Blood clot inside a blood vessel with a lead.  Damage to the heart.  Infection inside the heart (endocarditis).  Allergic reactions to medicines. What happens before the procedure? Staying hydrated Follow instructions from your health care provider about hydration, which may include:  Up to 2 hours before the procedure - you may continue to drink clear liquids, such as water, clear fruit juice, black coffee, and plain tea. Eating and drinking restrictions Follow instructions from your health care provider about eating and drinking, which may include:  8 hours before the procedure - stop eating heavy meals or foods such as meat, fried foods, or fatty foods.  6 hours before the procedure - stop eating light meals or foods, such as toast or cereal.  6 hours before the procedure - stop drinking milk or drinks that contain milk.  2 hours before the procedure - stop drinking clear liquids. Medicines  Ask your health care provider about: ? Changing or stopping your regular medicines. This is especially important if you are taking diabetes medicines or blood thinners. ? Taking medicines such as aspirin and ibuprofen. These medicines can thin your blood. Do not take these medicines before your procedure if your health care  provider instructs you not to.  You may be given antibiotic medicine to help prevent infection. General instructions  You will have a heart evaluation. This may include an electrocardiogram (ECG), chest X-ray, and heart imaging (echocardiogram,  or echo) tests.  You will have blood tests.  Do not use any products that contain nicotine or  tobacco, such as cigarettes and e-cigarettes. If you need help quitting, ask your health care provider.  Plan to have someone take you home from the hospital or clinic.  If you will be going home right after the procedure, plan to have someone with you for 24 hours.  Ask your health care provider how your surgical site will be marked or identified. What happens during the procedure?  To reduce your risk of infection: ? Your health care team will wash or sanitize their hands. ? Your skin will be washed with soap. ? Hair may be removed from the surgical area.  An IV tube will be inserted into one of your veins.  You will be given one or more of the following: ? A medicine to help you relax (sedative). ? A medicine to numb the area (local anesthetic). ? A medicine to make you fall asleep (general anesthetic).  If you are getting a transvenous pacemaker: ? An incision will be made in your upper chest. ? A pocket will be made for the pacemaker. It may be placed under the skin or between layers of muscle. ? The lead will be inserted into a blood vessel that returns to the heart. ? While X-rays are taken by an imaging machine (fluoroscopy), the lead will be advanced through the vein to the inside of your heart. ? The other end of the lead will be tunneled under the skin and attached to the pacemaker.  If you are getting an epicardial pacemaker: ? An incision will be made near your ribs or breastbone (sternum) for the lead. ? The lead will be attached to the outside of your heart. ? Another incision will be made in your chest or upper belly to create a pocket for the pacemaker. ? The free end of the lead will be tunneled under the skin and attached to the pacemaker.  The transvenous or epicardial pacemaker will be tested. Imaging studies may be done to check the lead position.  The incisions will be closed with stitches (sutures), adhesive strips, or skin glue.  Bandages (dressing) will  be placed over the incisions. The procedure may vary among health care providers and hospitals. What happens after the procedure?  Your blood pressure, heart rate, breathing rate, and blood oxygen level will be monitored until the medicines you were given have worn off.  You will be given antibiotics and pain medicine.  ECG and chest x-rays will be done.  You will wear a continuous type of ECG (Holter monitor) to check your heart rhythm.  Your health care provider will program the pacemaker.  Do not drive for 24 hours if you received a sedative. This information is not intended to replace advice given to you by your health care provider. Make sure you discuss any questions you have with your health care provider. Document Revised: 10/18/2017 Document Reviewed: 07/13/2015 Elsevier Patient Education  [place].

## 2019-09-21 ENCOUNTER — Ambulatory Visit (HOSPITAL_COMMUNITY): Payer: Medicare HMO

## 2019-09-21 ENCOUNTER — Ambulatory Visit (HOSPITAL_COMMUNITY)
Admission: RE | Disposition: A | Discharge status: Home / Self Care | Payer: Self-pay | Source: Home / Self Care | Attending: Internal Medicine

## 2019-09-21 ENCOUNTER — Other Ambulatory Visit: Payer: Self-pay

## 2019-09-21 ENCOUNTER — Ambulatory Visit (HOSPITAL_COMMUNITY)
Admission: RE | Admit: 2019-09-21 | Discharge: 2019-09-21 | Disposition: A | Discharge status: Home / Self Care | Payer: Medicare HMO | Attending: Internal Medicine | Admitting: Internal Medicine

## 2019-09-21 DIAGNOSIS — F419 Anxiety disorder, unspecified: Secondary | ICD-10-CM | POA: Diagnosis not present

## 2019-09-21 DIAGNOSIS — E669 Obesity, unspecified: Secondary | ICD-10-CM | POA: Insufficient documentation

## 2019-09-21 DIAGNOSIS — Z20822 Contact with and (suspected) exposure to covid-19: Secondary | ICD-10-CM | POA: Diagnosis not present

## 2019-09-21 DIAGNOSIS — Z6829 Body mass index (BMI) 29.0-29.9, adult: Secondary | ICD-10-CM | POA: Insufficient documentation

## 2019-09-21 DIAGNOSIS — R001 Bradycardia, unspecified: Secondary | ICD-10-CM | POA: Insufficient documentation

## 2019-09-21 DIAGNOSIS — M353 Polymyalgia rheumatica: Secondary | ICD-10-CM | POA: Insufficient documentation

## 2019-09-21 DIAGNOSIS — Z79899 Other long term (current) drug therapy: Secondary | ICD-10-CM | POA: Diagnosis not present

## 2019-09-21 DIAGNOSIS — F329 Major depressive disorder, single episode, unspecified: Secondary | ICD-10-CM | POA: Diagnosis not present

## 2019-09-21 DIAGNOSIS — F17210 Nicotine dependence, cigarettes, uncomplicated: Secondary | ICD-10-CM | POA: Diagnosis not present

## 2019-09-21 DIAGNOSIS — Z950 Presence of cardiac pacemaker: Secondary | ICD-10-CM | POA: Insufficient documentation

## 2019-09-21 DIAGNOSIS — I517 Cardiomegaly: Secondary | ICD-10-CM | POA: Diagnosis not present

## 2019-09-21 DIAGNOSIS — E559 Vitamin D deficiency, unspecified: Secondary | ICD-10-CM | POA: Insufficient documentation

## 2019-09-21 DIAGNOSIS — I4821 Permanent atrial fibrillation: Secondary | ICD-10-CM | POA: Insufficient documentation

## 2019-09-21 DIAGNOSIS — I495 Sick sinus syndrome: Secondary | ICD-10-CM | POA: Diagnosis present

## 2019-09-21 HISTORY — PX: PACEMAKER IMPLANT: EP1218

## 2019-09-21 LAB — SARS CORONAVIRUS 2 BY RT PCR (HOSPITAL ORDER, PERFORMED IN ~~LOC~~ HOSPITAL LAB): SARS Coronavirus 2: NEGATIVE

## 2019-09-21 SURGERY — PACEMAKER IMPLANT

## 2019-09-21 MED ORDER — MIDAZOLAM HCL 5 MG/5ML IJ SOLN
INTRAMUSCULAR | Status: AC
  Filled 2019-09-21: qty 5

## 2019-09-21 MED ORDER — CEFAZOLIN SODIUM-DEXTROSE 2-4 GM/100ML-% IV SOLN
2.0000 g | INTRAVENOUS | Status: AC
  Administered 2019-09-21: 2 g via INTRAVENOUS

## 2019-09-21 MED ORDER — MIDAZOLAM HCL 5 MG/5ML IJ SOLN
INTRAMUSCULAR | Status: DC | PRN
  Administered 2019-09-21 (×2): 1 mg via INTRAVENOUS
  Administered 2019-09-21 (×2): 2 mg via INTRAVENOUS
  Administered 2019-09-21 (×2): 1 mg via INTRAVENOUS

## 2019-09-21 MED ORDER — LIDOCAINE HCL (PF) 1 % IJ SOLN
INTRAMUSCULAR | Status: DC | PRN
  Administered 2019-09-21: 60 mL

## 2019-09-21 MED ORDER — ACETAMINOPHEN 325 MG PO TABS
ORAL_TABLET | ORAL | Status: AC
  Administered 2019-09-21: 325 mg via ORAL
  Filled 2019-09-21: qty 2

## 2019-09-21 MED ORDER — FENTANYL CITRATE (PF) 100 MCG/2ML IJ SOLN
INTRAMUSCULAR | Status: DC | PRN
  Administered 2019-09-21 (×2): 12.5 ug via INTRAVENOUS
  Administered 2019-09-21: 25 ug via INTRAVENOUS
  Administered 2019-09-21: 12.5 ug via INTRAVENOUS
  Administered 2019-09-21: 25 ug via INTRAVENOUS
  Administered 2019-09-21: 12.5 ug via INTRAVENOUS

## 2019-09-21 MED ORDER — HEPARIN (PORCINE) IN NACL 1000-0.9 UT/500ML-% IV SOLN
INTRAVENOUS | Status: DC | PRN
  Administered 2019-09-21: 500 mL

## 2019-09-21 MED ORDER — CHLORHEXIDINE GLUCONATE 4 % EX LIQD: 4.0000 "application " | Freq: Once | CUTANEOUS | Status: DC

## 2019-09-21 MED ORDER — ACETAMINOPHEN 325 MG PO TABS: 325.0000 mg | ORAL_TABLET | ORAL | Status: DC | PRN

## 2019-09-21 MED ORDER — LIDOCAINE HCL 1 % IJ SOLN
INTRAMUSCULAR | Status: AC
  Filled 2019-09-21: qty 60

## 2019-09-21 MED ORDER — ONDANSETRON HCL 4 MG/2ML IJ SOLN: 4.0000 mg | Freq: Four times a day (QID) | INTRAMUSCULAR | Status: DC | PRN

## 2019-09-21 MED ORDER — SODIUM CHLORIDE 0.9 % IV SOLN
INTRAVENOUS | Status: AC
  Filled 2019-09-21: qty 2

## 2019-09-21 MED ORDER — FENTANYL CITRATE (PF) 100 MCG/2ML IJ SOLN
INTRAMUSCULAR | Status: AC
  Filled 2019-09-21: qty 2

## 2019-09-21 MED ORDER — HEPARIN (PORCINE) IN NACL 1000-0.9 UT/500ML-% IV SOLN
INTRAVENOUS | Status: AC
  Filled 2019-09-21: qty 500

## 2019-09-21 MED ORDER — CEFAZOLIN SODIUM-DEXTROSE 1-4 GM/50ML-% IV SOLN
1.0000 g | Freq: Once | INTRAVENOUS | Status: AC
  Administered 2019-09-21: 1 g via INTRAVENOUS

## 2019-09-21 MED ORDER — SODIUM CHLORIDE 0.9 % IV SOLN: INTRAVENOUS | Status: DC

## 2019-09-21 MED ORDER — SODIUM CHLORIDE 0.9 % IV SOLN
80.0000 mg | INTRAVENOUS | Status: AC
  Administered 2019-09-21: 80 mg
  Filled 2019-09-21: qty 2

## 2019-09-21 MED ORDER — YOU HAVE A PACEMAKER BOOK
Status: AC
  Filled 2019-09-21: qty 1

## 2019-09-21 MED ORDER — CEFAZOLIN SODIUM-DEXTROSE 2-4 GM/100ML-% IV SOLN
INTRAVENOUS | Status: AC
  Filled 2019-09-21: qty 100

## 2019-09-21 SURGICAL SUPPLY — 10 items
CABLE SURGICAL S-101-97-12 (CABLE) ×3 IMPLANT
CATH SELECT PACE 669183 (CATHETERS) ×2 IMPLANT
CUTTER LV DELIVERY CATHETER 7 (MISCELLANEOUS) ×2 IMPLANT
LEAD INGEVITY 7842 59 (Lead) ×2 IMPLANT
PACEMAKER ACCOLADE SR (Pacemaker) ×2 IMPLANT
PAD PRO RADIOLUCENT 2001M-C (PAD) ×3 IMPLANT
SHEATH 8FR PRELUDE SNAP 13 (SHEATH) ×2 IMPLANT
SHEATH PROBE COVER 6X72 (BAG) ×2 IMPLANT
TRAY PACEMAKER INSERTION (PACKS) ×3 IMPLANT
WIRE HI TORQ VERSACORE-J 145CM (WIRE) ×2 IMPLANT

## 2019-09-21 NOTE — Progress Notes (Signed)
Dr Lovena Le in and saw cxr results and ok to d/c home

## 2019-09-21 NOTE — Discharge Instructions (Signed)
After Your Pacemaker   . You have a Chiropractor  ACTIVITY . Do not lift your arm above shoulder height for 1 week after your procedure. After 7 days, you may progress as below.     Monday September 28, 2019  Tuesday September 29, 2019 Wednesday September 30, 2019 Thursday October 01, 2019   . Do not lift, push, pull, or carry anything over 10 pounds with the affected arm until 6 weeks (Monday November 02, 2019 ) after your procedure.   . Do NOT DRIVE until you have been seen for your wound check, or as long as instructed by your healthcare provider.   . Ask your healthcare provider when you can go back to work   INCISION/Dressin  . Monitor your Pacemaker site for redness, swelling, and drainage. Call the device clinic at [phone] if you experience these symptoms or fever/chills.  . If your incision is sealed with Steri-strips or staples, you may shower 10 days after your procedure or when told by your provider. Do not remove the steri-strips or let the shower hit directly on your site. You may wash around your site with soap and water.    Marland Kitchen Avoid lotions, ointments, or perfumes over your incision until it is well-healed.  . You may use a hot tub or a pool AFTER your wound check appointment if the incision is completely closed.  Marland Kitchen PAcemaker Alerts:  Some alerts are vibratory and others beep. These are NOT emergencies. Please call our office to let us know. If this occurs at night or on weekends, it can wait until the next business day. Send a remote transmission.  . If your device is capable of reading fluid status (for heart failure), you will be offered monthly monitoring to review this with you.   DEVICE MANAGEMENT . Remote monitoring is used to monitor your pacemaker from home. This monitoring is scheduled every 91 days by our office. It allows [place] to keep an eye on the functioning of your device to ensure it is working properly. You will routinely see your  Electrophysiologist annually (more often if necessary).   . You should receive your ID card for your new device in 4-8 weeks. Keep this card with you at all times once received. Consider wearing a medical alert bracelet or necklace.  . Your Pacemaker may be MRI compatible. This will be discussed at your next office visit/wound check.  You should avoid contact with strong electric or magnetic fields.    Do not use amateur (ham) radio equipment or electric (arc) welding torches. MP3 player headphones with magnets should not be used. Some devices are safe to use if held at least 12 inches (30 cm) from your Pacemaker. These include power tools, lawn mowers, and speakers. If you are unsure if something is safe to use, ask your health care provider.   When using your cell phone, hold it to the ear that is on the opposite side from the Pacemaker. Do not leave your cell phone in a pocket over the Pacemaker.   You may safely use electric blankets, heating pads, computers, and microwave ovens.  Call the office right away if:  You have chest pain.  You feel more short of breath than you have felt before.  You feel more light-headed than you have felt before.  Your incision starts to open up.  This information is not intended to replace advice given to you by your health care provider. Make sure you discuss  any questions you have with your health care provider    Pacemaker Implantation, Adult, Care After This sheet gives you information about how to care for yourself after your procedure. Your health care provider may also give you more specific instructions. If you have problems or questions, contact your health care provider. What can I expect after the procedure? After the procedure, it is common to have:  Mild pain.  Slight bruising.  Some swelling over the incision.  A slight bump over the skin where the device was placed. Sometimes, it is possible to feel the device under the skin.  This is normal. Follow these instructions at home: Medicines  Take over-the-counter and prescription medicines only as told by your health care provider.  If you were prescribed an antibiotic medicine, take it as told by your health care provider. Do not stop taking the antibiotic even if you start to feel better. Wound care   Do not remove the bandage on your chest until directed to do so by your health care provider.  After your bandage is removed, you may see pieces of tape called skin adhesive strips over the area where the cut was made (incision site). Let them fall off on their own.  Check the incision site every day to make sure it is not infected, bleeding, or starting to pull apart.  Do not use lotions or ointments near the incision site unless directed to do so.  Keep the incision area clean and dry for 2-3 days after the procedure or as directed by your health care provider. It takes several weeks for the incision site to completely heal.  Do not take baths, swim, or use a hot tub for 7-10 days or as otherwise directed by your health care provider. Activity  Do not drive or use heavy machinery while taking prescription pain medicine.  Do not drive for 24 hours if you were given a medicine to help you relax (sedative).  Check with your health care provider before you start to drive or play sports.  Avoid sudden jerking, pulling, or chopping movements that pull your upper arm far away from your body. Avoid these movements for at least 6 weeks or as long as told by your health care provider.  Do not lift your upper arm above your shoulders for at least 6 weeks or as long as told by your health care provider. This means no tennis, golf, or swimming.  You may go back to work when your health care provider says it is okay. Pacemaker care  You may be shown how to transfer data from your pacemaker through the phone to your health care provider.  Always let all health care  providers know about your pacemaker before you have any medical procedures or tests.  Wear a medical ID bracelet or necklace stating that you have a pacemaker. Carry a pacemaker ID card with you at all times.  Your pacemaker battery will last for 5-15 years. Routine checks by your health care provider will let the health care provider know when the battery is starting to run down. The pacemaker will need to be replaced when the battery starts to run down.  Do not use amateur Chief of Staff. Other electrical devices are safe to use, including power tools, lawn mowers, and speakers. If you are unsure of whether something is safe to use, ask your health care provider.  When using your cell phone, hold it to the ear opposite the  pacemaker. Do not leave your cell phone in a pocket over the pacemaker.  Avoid places or objects that have a strong electric or magnetic field, including: ? Airport Herbalist. When at the airport, let officials know that you have a pacemaker. ? Power plants. ? Large electrical generators. ? Radiofrequency transmission towers, such as cell phone and radio towers. General instructions  Weigh yourself every day. If you suddenly gain weight, fluid may be building up in your body.  Keep all follow-up visits as told by your health care provider. This is important. Contact a health care provider if:  You gain weight suddenly.  Your legs or feet swell.  It feels like your heart is fluttering or skipping beats (heart palpitations).  You have chills or a fever.  You have more redness, swelling, or pain around your incisions.  You have more fluid or blood coming from your incisions.  Your incisions feel warm to the touch.  You have pus or a bad smell coming from your incisions. Get help right away if:  You have chest pain.  You have trouble breathing or are short of breath.  You become extremely tired.  You are light-headed or  you faint. This information is not intended to replace advice given to you by your health care provider. Make sure you discuss any questions you have with your health care provider. Document Revised: 01/11/2017 Document Reviewed: 11/11/2015 Elsevier Patient Education  2020 [employer].

## 2019-09-21 NOTE — [place] Notes (Addendum)
error 

## 2019-09-21 NOTE — Interval H&P Note (Signed)
History and Physical Interval Note:  09/21/2019 10:39 PM  Melissa Finley  has presented today for surgery, with the diagnosis of bradicardia.  The various methods of treatment have been discussed with the patient and family. After consideration of risks, benefits and other options for treatment, the patient has consented to  Procedure(s): PACEMAKER IMPLANT (N/A) as a surgical intervention.  The patient's history has been reviewed, patient examined, no change in status, stable for surgery.  I have reviewed the patient's chart and labs.  Questions were answered to the patient's satisfaction.     Cristopher Peru

## 2019-09-22 ENCOUNTER — Encounter (HOSPITAL_COMMUNITY): Payer: Self-pay | Admitting: Internal Medicine

## 2019-09-22 MED FILL — Lidocaine HCl Local Inj 1%: INTRAMUSCULAR | Qty: 60 | Status: AC

## 2019-09-22 NOTE — [place] Notes (Signed)
C/o pacer pocket pain, controlled with tylenol, no edema in arm pit nor at site, advised to call Dr taylor's office with further questions or concerns.

## 2019-10-01 ENCOUNTER — Other Ambulatory Visit: Payer: Self-pay

## 2019-10-01 ENCOUNTER — Ambulatory Visit (INDEPENDENT_AMBULATORY_CARE_PROVIDER_SITE_OTHER): Payer: Medicare HMO | Admitting: Emergency Medicine

## 2019-10-01 DIAGNOSIS — I495 Sick sinus syndrome: Secondary | ICD-10-CM

## 2019-10-01 LAB — CUP PACEART INCLINIC DEVICE CHECK
Brady Statistic RV Percent Paced: 3 %
Date Time Interrogation Session: 20210819000000
Implantable Lead Implant Date: 20210809
Implantable Lead Location: 753860
Implantable Lead Model: 7842
Implantable Lead Serial Number: 1052152
Implantable Pulse Generator Implant Date: 20210809
Lead Channel Impedance Value: 610 Ohm
Lead Channel Pacing Threshold Amplitude: 1 V
Lead Channel Pacing Threshold Pulse Width: 0.4 ms
Lead Channel Sensing Intrinsic Amplitude: 7.7 mV
Lead Channel Setting Pacing Amplitude: 3.5 V
Lead Channel Setting Pacing Pulse Width: 0.4 ms
Lead Channel Setting Sensing Sensitivity: 2.5 mV
Pulse Gen Serial Number: 813574

## 2019-10-01 NOTE — [place] Notes (Signed)
Wound check appointment. Steri-strips removed. Wound without redness or edema. Incision edges approximated, wound well healed. Normal device function. Thresholds, sensing, and impedances consistent with implant measurements. Device programmed at 3.5V for extra safety margin until 3 month visit. Histogram distribution appropriate for patient and level of activity. 35 ventricular arrhythmias noted, 34 NSVT episodes available EGMs show episodes of < 10 seconds and 1 episode that appears to be St at 181 bpm.  Patient educated about wound care, arm mobility, lifting restrictions, shock plan. ROV ion 12/22/19.Enrolled in remote follow-up and next remote scheduled for 12/22/19.

## 2019-10-20 DIAGNOSIS — Z950 Presence of cardiac pacemaker: Secondary | ICD-10-CM | POA: Diagnosis not present

## 2019-10-20 DIAGNOSIS — Z23 Encounter for immunization: Secondary | ICD-10-CM | POA: Diagnosis not present

## 2019-10-20 DIAGNOSIS — I480 Paroxysmal atrial fibrillation: Secondary | ICD-10-CM | POA: Diagnosis not present

## 2019-10-20 DIAGNOSIS — I495 Sick sinus syndrome: Secondary | ICD-10-CM | POA: Diagnosis not present

## 2019-12-22 ENCOUNTER — Encounter: Payer: Self-pay | Admitting: Internal Medicine

## 2019-12-22 ENCOUNTER — Other Ambulatory Visit: Payer: Self-pay

## 2019-12-22 ENCOUNTER — Ambulatory Visit (INDEPENDENT_AMBULATORY_CARE_PROVIDER_SITE_OTHER): Payer: Medicare HMO

## 2019-12-22 ENCOUNTER — Ambulatory Visit (INDEPENDENT_AMBULATORY_CARE_PROVIDER_SITE_OTHER): Payer: Medicare HMO | Admitting: Internal Medicine

## 2019-12-22 VITALS — BP 112/66 | HR 97 | Ht 62.0 in | Wt 152.8 lb

## 2019-12-22 DIAGNOSIS — I495 Sick sinus syndrome: Secondary | ICD-10-CM

## 2019-12-22 DIAGNOSIS — Z950 Presence of cardiac pacemaker: Secondary | ICD-10-CM | POA: Insufficient documentation

## 2019-12-22 DIAGNOSIS — I480 Paroxysmal atrial fibrillation: Secondary | ICD-10-CM

## 2019-12-22 LAB — CUP PACEART REMOTE DEVICE CHECK
Battery Remaining Longevity: 114 mo
Battery Remaining Percentage: 100 %
Brady Statistic RV Percent Paced: 3 %
Date Time Interrogation Session: 20211109031100
Implantable Lead Implant Date: 20210809
Implantable Lead Location: 753860
Implantable Lead Model: 7842
Implantable Lead Serial Number: 1052152
Implantable Pulse Generator Implant Date: 20210809
Lead Channel Impedance Value: 728 Ohm
Lead Channel Pacing Threshold Amplitude: 0.8 V
Lead Channel Pacing Threshold Pulse Width: 0.4 ms
Lead Channel Setting Pacing Amplitude: 3.5 V
Lead Channel Setting Pacing Pulse Width: 0.4 ms
Lead Channel Setting Sensing Sensitivity: 2.5 mV
Pulse Gen Serial Number: 813574

## 2019-12-22 MED ORDER — METOPROLOL SUCCINATE ER 100 MG PO TB24
ORAL_TABLET | ORAL | 3 refills | Status: DC
Start: 2019-12-22 — End: 2020-11-16

## 2019-12-22 NOTE — Patient Instructions (Addendum)
Medication Instructions:  Your physician has recommended you make the following change in your medication:   1.  STOP taking diltiazem  2.  START taking metoprolol succinate (Toprol XL) 100 mg-  A-Take 1/2 tablet by mouth daily for 2 weeks THEN  B-Increase to 1 tablet by mouth daily thereafter  Labwork: None ordered.  Testing/Procedures: None ordered.  Follow-Up: Your physician wants you to follow-up in: one year with Dr. Lovena Le.   You will receive a reminder letter in the mail two months in advance. If you don't receive a letter, please call our office to schedule the follow-up appointment.  Remote monitoring is used to monitor your Pacemaker from home. This monitoring reduces the number of office visits required to check your device to one time per year. It allows [place] to keep an eye on the functioning of your device to ensure it is working properly. You are scheduled for a device check from home on 03/22/2020. You may send your transmission at any time that day. If you have a wireless device, the transmission will be sent automatically. After your physician reviews your transmission, you will receive a postcard with your next transmission date.  Any Other Special Instructions Will Be Listed Below (If Applicable).  If you need a refill on your cardiac medications before your next appointment, please call your pharmacy.

## 2019-12-22 NOTE — [place] Notes (Signed)
HPI Melissa Finley returns today for followup. She has developed atrial fib with a RVR and a slow VR and symptomatic bradycardia and underwent PPM insertion. She has had problems with peripheral edema. She has not had syncope. SHe denies chest pain or sob. She has had some sodium indiscretion.  No Known Allergies   Current Outpatient Medications  Medication Sig Dispense Refill  . alendronate (FOSAMAX) 70 MG tablet Take 70 mg by mouth once a week. Sunday    . ALPRAZolam (XANAX) 0.5 MG tablet Take 0.5 mg by mouth 2 (two) times daily as needed for anxiety.     Marland Kitchen ascorbic acid (VITAMIN C) 500 MG tablet Take 500 mg by mouth daily.    . B Complex Vitamins (VITAMIN B COMPLEX PO) Take 1 tablet by mouth daily.     . Calcium Carbonate-Vitamin D (CALCIUM 600 + D PO) Take 1 tablet by mouth at bedtime.     Marland Kitchen CARTIA XT 240 MG 24 hr capsule Take 240 mg by mouth daily.     Marland Kitchen loratadine (CLARITIN) 10 MG tablet Take 10 mg by mouth daily.    Marland Kitchen LYSINE PO Take 1 tablet by mouth daily.     . Multiple Vitamin (MULTIVITAMIN) tablet Take 1 tablet by mouth daily.    . predniSONE (DELTASONE) 10 MG tablet Take 1 tablet by mouth daily as needed (inflammation).     . Probiotic Product (PROBIOTIC PO) Take 1 capsule by mouth daily.     . valACYclovir (VALTREX) 1000 MG tablet Take 1,000 mg by mouth 2 (two) times daily as needed (fever blisters).      No current facility-administered medications for this visit.     Past Medical History:  Diagnosis Date  . Allergic rhinitis   . Anxiety   . ATRIAL FLUTTER 05/04/2009   Qualifier: Diagnosis of  By: Melissa Le, MD, Melissa Finley   . DDD (degenerative disc disease)    a. L5-S1, MRI 05/2006  . Depression   . Dupuytren's contracture   . Essential hypertension, benign 05/04/2009   Qualifier: Diagnosis of  By: Melissa Le, MD, Melissa Finley   . HTN (hypertension)   . Left knee pain 02/08/2014  . Obesity   . Osteoarthritis    a. knees  . Palpitations   .  Paroxysmal atrial flutter ([facility])   . PMR (polymyalgia rheumatica) (HCC)   . Rupture of anterior cruciate ligament of left knee 02/08/2014  . SUPRAVENTRICULAR TACHYCARDIA 05/03/2009   Qualifier: Diagnosis of  By: Melissa Finley Melissa Finley, Melissa Finley    . Thyroid nodule    a. negative bx 2001 - right lobe  . Vitamin D deficiency     ROS:   All systems reviewed and negative except as noted in the HPI.   Past Surgical History:  Procedure Laterality Date  . ABDOMINAL HYSTERECTOMY    . BREAST EXCISIONAL BIOPSY Left 1982  . CESAREAN SECTION    . KNEE SURGERY    . NISSEN FUNDOPLICATION    . PACEMAKER IMPLANT N/A 09/21/2019   Procedure: PACEMAKER IMPLANT;  Surgeon: Melissa Lance, MD;  Location: [place] [facility];  Service: Cardiovascular;  Laterality: N/A;     History reviewed. No pertinent family history.   Social History   Socioeconomic History  . Marital status: Married    Spouse name: Not on file  . Number of children: Not on file  . Years of education: Not on file  . Highest education level: Not on file  Occupational History  . Not on file  Tobacco Use  . Smoking status: Current Every Day Smoker    Types: Cigarettes  . Smokeless tobacco: Never Used  Substance and Sexual Activity  . Alcohol use: Not on file  . Drug use: Not on file  . Sexual activity: Not on file  Other Topics Concern  . Not on file  Social History Narrative  . Not on file   Social Determinants of Health   Financial Resource Strain:   . Difficulty of Paying Living Expenses: Not on file  Food Insecurity:   . Worried About Charity fundraiser in the Last Year: Not on file  . Ran Out of Food in the Last Year: Not on file  Transportation Needs:   . Lack of Transportation (Medical): Not on file  . Lack of Transportation (Non-Medical): Not on file  Physical Activity:   . Days of Exercise per Week: Not on file  . Minutes of Exercise per Session: Not on file  Stress:   . Feeling of Stress : Not on file  Social  Connections:   . Frequency of Communication with Friends and Family: Not on file  . Frequency of Social Gatherings with Friends and Family: Not on file  . Attends Religious Services: Not on file  . Active Member of Clubs or Organizations: Not on file  . Attends Archivist Meetings: Not on file  . Marital Status: Not on file  Intimate Partner Violence:   . Fear of Current or Ex-Partner: Not on file  . Emotionally Abused: Not on file  . Physically Abused: Not on file  . Sexually Abused: Not on file     BP 112/66   Pulse 97   Ht 5\' 2"  (1.575 m)   Wt 152 lb 12.8 oz (69.3 kg)   SpO2 92%   BMI 27.95 kg/m   Physical Exam:  Well appearing 74 yo woman, NAD HEENT: Unremarkable Neck:  6 cm JVD, no thyromegally Lymphatics:  No adenopathy Back:  No CVA tenderness Lungs:  Clear with no wheezes HEART:  IRegular rate rhythm, no murmurs, no rubs, no clicks Abd:  soft, positive bowel sounds, no organomegally, no rebound, no guarding Ext:  2 plus pulses, 1+ edema, no cyanosis, no clubbing Skin:  No rashes no nodules Neuro:  CN II through XII intact, motor grossly intact  EKG - atrial fib with a CVR  DEVICE  Normal device function.  See PaceArt for details.   Assess/Plan: 1. Peripheral edema - I asked her to stop cardizem and switch rate control to toprol. 2. PPM - her b[place] Sci DDD PM is working normally 3. Atrial fib -her rates are not well controlled. She will have her beta blocker uptitrated as tolerated 4. Coags - she has a CHADSVASC score of 2 denying HTN. As she is female, we will hold off on systemic anti-coagulation.   Melissa Finley Melissa Arambula,MD

## 2019-12-23 NOTE — [place] Notes (Signed)
Remote pacemaker transmission.   

## 2020-03-22 ENCOUNTER — Ambulatory Visit (INDEPENDENT_AMBULATORY_CARE_PROVIDER_SITE_OTHER): Payer: Medicare HMO

## 2020-03-22 DIAGNOSIS — I495 Sick sinus syndrome: Secondary | ICD-10-CM

## 2020-03-22 LAB — CUP PACEART REMOTE DEVICE CHECK
Battery Remaining Longevity: 108 mo
Battery Remaining Percentage: 100 %
Brady Statistic RV Percent Paced: 3 %
Date Time Interrogation Session: 20220208031100
Implantable Lead Implant Date: 20210809
Implantable Lead Location: 753860
Implantable Lead Model: 7842
Implantable Lead Serial Number: 1052152
Implantable Pulse Generator Implant Date: 20210809
Lead Channel Impedance Value: 727 Ohm
Lead Channel Pacing Threshold Amplitude: 0.8 V
Lead Channel Pacing Threshold Pulse Width: 0.4 ms
Lead Channel Setting Pacing Amplitude: 1.3 V
Lead Channel Setting Pacing Pulse Width: 0.4 ms
Lead Channel Setting Sensing Sensitivity: 2.5 mV
Pulse Gen Serial Number: 813574

## 2020-03-28 NOTE — [place] Notes (Signed)
Remote pacemaker transmission.   

## 2020-05-14 DIAGNOSIS — L509 Urticaria, unspecified: Secondary | ICD-10-CM | POA: Diagnosis not present

## 2020-05-14 DIAGNOSIS — R21 Rash and other nonspecific skin eruption: Secondary | ICD-10-CM | POA: Diagnosis not present

## 2020-06-20 DIAGNOSIS — Z950 Presence of cardiac pacemaker: Secondary | ICD-10-CM | POA: Diagnosis not present

## 2020-06-20 DIAGNOSIS — Z23 Encounter for immunization: Secondary | ICD-10-CM | POA: Diagnosis not present

## 2020-06-20 DIAGNOSIS — Z79899 Other long term (current) drug therapy: Secondary | ICD-10-CM | POA: Diagnosis not present

## 2020-06-20 DIAGNOSIS — F3340 Major depressive disorder, recurrent, in remission, unspecified: Secondary | ICD-10-CM | POA: Diagnosis not present

## 2020-06-20 DIAGNOSIS — M8580 Other specified disorders of bone density and structure, unspecified site: Secondary | ICD-10-CM | POA: Diagnosis not present

## 2020-06-20 DIAGNOSIS — Z0000 Encounter for general adult medical examination without abnormal findings: Secondary | ICD-10-CM | POA: Diagnosis not present

## 2020-06-20 DIAGNOSIS — E049 Nontoxic goiter, unspecified: Secondary | ICD-10-CM | POA: Diagnosis not present

## 2020-06-20 DIAGNOSIS — D692 Other nonthrombocytopenic purpura: Secondary | ICD-10-CM | POA: Diagnosis not present

## 2020-06-20 DIAGNOSIS — I700 Atherosclerosis of aorta: Secondary | ICD-10-CM | POA: Diagnosis not present

## 2020-06-20 DIAGNOSIS — Z780 Asymptomatic menopausal state: Secondary | ICD-10-CM | POA: Diagnosis not present

## 2020-06-20 DIAGNOSIS — R911 Solitary pulmonary nodule: Secondary | ICD-10-CM | POA: Diagnosis not present

## 2020-06-20 DIAGNOSIS — Z1389 Encounter for screening for other disorder: Secondary | ICD-10-CM | POA: Diagnosis not present

## 2020-06-21 ENCOUNTER — Ambulatory Visit (INDEPENDENT_AMBULATORY_CARE_PROVIDER_SITE_OTHER): Payer: Medicare HMO

## 2020-06-21 ENCOUNTER — Other Ambulatory Visit: Payer: Self-pay | Admitting: Geriatric Medicine

## 2020-06-21 DIAGNOSIS — I495 Sick sinus syndrome: Secondary | ICD-10-CM

## 2020-06-21 DIAGNOSIS — M8580 Other specified disorders of bone density and structure, unspecified site: Secondary | ICD-10-CM

## 2020-06-21 LAB — CUP PACEART REMOTE DEVICE CHECK
Battery Remaining Longevity: 102 mo
Battery Remaining Percentage: 100 %
Brady Statistic RV Percent Paced: 4 %
Date Time Interrogation Session: 20220510031100
Implantable Lead Implant Date: 20210809
Implantable Lead Location: 753860
Implantable Lead Model: 7842
Implantable Lead Serial Number: 1052152
Implantable Pulse Generator Implant Date: 20210809
Lead Channel Impedance Value: 707 Ohm
Lead Channel Pacing Threshold Amplitude: 1 V
Lead Channel Pacing Threshold Pulse Width: 0.4 ms
Lead Channel Setting Pacing Amplitude: 1.5 V
Lead Channel Setting Pacing Pulse Width: 0.4 ms
Lead Channel Setting Sensing Sensitivity: 2.5 mV
Pulse Gen Serial Number: 813574

## 2020-07-08 ENCOUNTER — Other Ambulatory Visit: Payer: Self-pay | Admitting: Geriatric Medicine

## 2020-07-08 DIAGNOSIS — Z1231 Encounter for screening mammogram for malignant neoplasm of breast: Secondary | ICD-10-CM

## 2020-07-13 NOTE — [place] Notes (Signed)
Remote pacemaker transmission.   

## 2020-08-08 DIAGNOSIS — H5213 Myopia, bilateral: Secondary | ICD-10-CM | POA: Diagnosis not present

## 2020-08-10 DIAGNOSIS — M170 Bilateral primary osteoarthritis of knee: Secondary | ICD-10-CM | POA: Diagnosis not present

## 2020-08-30 DIAGNOSIS — M1712 Unilateral primary osteoarthritis, left knee: Secondary | ICD-10-CM | POA: Diagnosis not present

## 2020-08-30 DIAGNOSIS — M170 Bilateral primary osteoarthritis of knee: Secondary | ICD-10-CM | POA: Diagnosis not present

## 2020-09-02 ENCOUNTER — Ambulatory Visit
Admission: RE | Admit: 2020-09-02 | Discharge: 2020-09-02 | Disposition: A | Discharge status: Home / Self Care | Payer: Medicare HMO | Source: Ambulatory Visit | Attending: Geriatric Medicine | Admitting: Geriatric Medicine

## 2020-09-02 ENCOUNTER — Other Ambulatory Visit: Payer: Self-pay

## 2020-09-02 DIAGNOSIS — Z780 Asymptomatic menopausal state: Secondary | ICD-10-CM | POA: Diagnosis not present

## 2020-09-02 DIAGNOSIS — M8589 Other specified disorders of bone density and structure, multiple sites: Secondary | ICD-10-CM | POA: Diagnosis not present

## 2020-09-02 DIAGNOSIS — M8580 Other specified disorders of bone density and structure, unspecified site: Secondary | ICD-10-CM

## 2020-09-02 DIAGNOSIS — Z1231 Encounter for screening mammogram for malignant neoplasm of breast: Secondary | ICD-10-CM

## 2020-09-05 ENCOUNTER — Ambulatory Visit: Payer: Medicare HMO

## 2020-09-07 DIAGNOSIS — L720 Epidermal cyst: Secondary | ICD-10-CM | POA: Diagnosis not present

## 2020-09-07 DIAGNOSIS — L821 Other seborrheic keratosis: Secondary | ICD-10-CM | POA: Diagnosis not present

## 2020-09-07 DIAGNOSIS — D1801 Hemangioma of skin and subcutaneous tissue: Secondary | ICD-10-CM | POA: Diagnosis not present

## 2020-09-07 DIAGNOSIS — L812 Freckles: Secondary | ICD-10-CM | POA: Diagnosis not present

## 2020-09-07 DIAGNOSIS — L905 Scar conditions and fibrosis of skin: Secondary | ICD-10-CM | POA: Diagnosis not present

## 2020-09-07 DIAGNOSIS — D692 Other nonthrombocytopenic purpura: Secondary | ICD-10-CM | POA: Diagnosis not present

## 2020-09-07 DIAGNOSIS — L814 Other melanin hyperpigmentation: Secondary | ICD-10-CM | POA: Diagnosis not present

## 2020-09-07 DIAGNOSIS — L819 Disorder of pigmentation, unspecified: Secondary | ICD-10-CM | POA: Diagnosis not present

## 2020-09-07 DIAGNOSIS — L298 Other pruritus: Secondary | ICD-10-CM | POA: Diagnosis not present

## 2020-09-20 ENCOUNTER — Ambulatory Visit (INDEPENDENT_AMBULATORY_CARE_PROVIDER_SITE_OTHER): Payer: Medicare HMO

## 2020-09-20 DIAGNOSIS — I495 Sick sinus syndrome: Secondary | ICD-10-CM | POA: Diagnosis not present

## 2020-09-20 LAB — CUP PACEART REMOTE DEVICE CHECK
Battery Remaining Longevity: 102 mo
Battery Remaining Percentage: 100 %
Brady Statistic RV Percent Paced: 3 %
Date Time Interrogation Session: 20220809031200
Implantable Lead Implant Date: 20210809
Implantable Lead Location: 753860
Implantable Lead Model: 7842
Implantable Lead Serial Number: 1052152
Implantable Pulse Generator Implant Date: 20210809
Lead Channel Impedance Value: 723 Ohm
Lead Channel Pacing Threshold Amplitude: 0.9 V
Lead Channel Pacing Threshold Pulse Width: 0.4 ms
Lead Channel Setting Pacing Amplitude: 1.3 V
Lead Channel Setting Pacing Pulse Width: 0.4 ms
Lead Channel Setting Sensing Sensitivity: 2.5 mV
Pulse Gen Serial Number: 813574

## 2020-10-13 NOTE — [place] Notes (Signed)
Remote pacemaker transmission.   

## 2020-11-16 ENCOUNTER — Other Ambulatory Visit: Payer: Self-pay | Admitting: Internal Medicine

## 2020-12-15 ENCOUNTER — Other Ambulatory Visit: Payer: Medicare HMO

## 2020-12-20 ENCOUNTER — Ambulatory Visit (INDEPENDENT_AMBULATORY_CARE_PROVIDER_SITE_OTHER): Payer: Medicare HMO

## 2020-12-20 DIAGNOSIS — I495 Sick sinus syndrome: Secondary | ICD-10-CM | POA: Diagnosis not present

## 2020-12-20 LAB — CUP PACEART REMOTE DEVICE CHECK
Battery Remaining Longevity: 96 mo
Battery Remaining Percentage: 100 %
Brady Statistic RV Percent Paced: 3 %
Date Time Interrogation Session: 20221108031200
Implantable Lead Implant Date: 20210809
Implantable Lead Location: 753860
Implantable Lead Model: 7842
Implantable Lead Serial Number: 1052152
Implantable Pulse Generator Implant Date: 20210809
Lead Channel Impedance Value: 714 Ohm
Lead Channel Pacing Threshold Amplitude: 0.8 V
Lead Channel Pacing Threshold Pulse Width: 0.4 ms
Lead Channel Setting Pacing Amplitude: 1.3 V
Lead Channel Setting Pacing Pulse Width: 0.4 ms
Lead Channel Setting Sensing Sensitivity: 2.5 mV
Pulse Gen Serial Number: 813574

## 2020-12-27 ENCOUNTER — Ambulatory Visit (INDEPENDENT_AMBULATORY_CARE_PROVIDER_SITE_OTHER): Payer: Medicare HMO | Admitting: Internal Medicine

## 2020-12-27 ENCOUNTER — Encounter: Payer: Self-pay | Admitting: Internal Medicine

## 2020-12-27 ENCOUNTER — Other Ambulatory Visit: Payer: Self-pay

## 2020-12-27 VITALS — BP 126/74 | HR 100 | Ht 61.0 in | Wt 153.2 lb

## 2020-12-27 DIAGNOSIS — Z950 Presence of cardiac pacemaker: Secondary | ICD-10-CM

## 2020-12-27 DIAGNOSIS — I495 Sick sinus syndrome: Secondary | ICD-10-CM | POA: Diagnosis not present

## 2020-12-27 DIAGNOSIS — I480 Paroxysmal atrial fibrillation: Secondary | ICD-10-CM

## 2020-12-27 MED ORDER — RIVAROXABAN 20 MG PO TABS
20.0000 mg | ORAL_TABLET | Freq: Every day | ORAL | 11 refills | Status: DC
Start: 2020-12-27 — End: 2022-02-15

## 2020-12-27 MED ORDER — DIGOXIN 125 MCG PO TABS
ORAL_TABLET | ORAL | 3 refills | Status: DC
Start: 2020-12-27 — End: 2022-02-14

## 2020-12-27 NOTE — [facility] Notes (Signed)
HPI [name] returns today for followup. She has developed atrial fib with a RVR and a slow VR and symptomatic bradycardia and underwent PPM insertion. She has had problems with peripheral edema. She has not had syncope. She denies chest pain or sob. She has had some sodium indiscretion. Her rates are not well controlled. We tried 100 mg of toprol but she could not tolerate.  No Known Allergies   Current Outpatient Medications  Medication Sig Dispense Refill   ALPRAZolam (XANAX) 0.5 MG tablet Take 0.5 mg by mouth 2 (two) times daily as needed for anxiety.      ascorbic acid (VITAMIN C) 500 MG tablet Take 500 mg by mouth daily.     B Complex Vitamins (VITAMIN B COMPLEX PO) Take 1 tablet by mouth daily.      Calcium Carbonate-Vitamin D (CALCIUM 600 + D PO) Take 1 tablet by mouth at bedtime.      loratadine (CLARITIN) 10 MG tablet Take 10 mg by mouth daily.     LYSINE PO Take 1 tablet by mouth daily.      metoprolol succinate (TOPROL-XL) 100 MG 24 hr tablet Take 1 tablet by mouth daily. Please keep upcoming appt in November 2022 with Dr. Lovena Finley before anymore refills. Thank you (Patient taking differently: Take 50 mg by mouth daily. Take 1 tablet by mouth daily. Please keep upcoming appt in November 2022 with Dr. Lovena Finley before anymore refills. Thank you) 90 tablet 0   Multiple Vitamin (MULTIVITAMIN) tablet Take 1 tablet by mouth daily.     Probiotic Product (PROBIOTIC PO) Take 1 capsule by mouth daily.      valACYclovir (VALTREX) 1000 MG tablet Take 1,000 mg by mouth 2 (two) times daily as needed (fever blisters).      alendronate (FOSAMAX) 70 MG tablet Take 70 mg by mouth once a week. Sunday (Patient not taking: Reported on 12/27/2020)     predniSONE (DELTASONE) 10 MG tablet Take 1 tablet by mouth daily as needed (inflammation).  (Patient not taking: Reported on 12/27/2020)     No current facility-administered medications for this visit.     Past Medical History:  Diagnosis  Date   Allergic rhinitis    Anxiety    ATRIAL FLUTTER 05/04/2009   Qualifier: Diagnosis of  By: Melissa Le, MD, Melissa Finley    DDD (degenerative disc disease)    a. L5-S1, MRI 05/2006   Depression    Dupuytren's contracture    Essential hypertension, benign 05/04/2009   Qualifier: Diagnosis of  By: Melissa Le, MD, [facility], Melissa Finley    HTN (hypertension)    Left knee pain 02/08/2014   Obesity    Osteoarthritis    a. knees   Palpitations    Paroxysmal atrial flutter (HCC)    PMR (polymyalgia rheumatica) (HCC)    Rupture of anterior cruciate ligament of left knee 02/08/2014   SUPRAVENTRICULAR TACHYCARDIA 05/03/2009   Qualifier: Diagnosis of  By: Melissa Finley CMA, Melissa Finley     Thyroid nodule    a. negative bx 2001 - right lobe   Vitamin D deficiency     ROS:   All systems reviewed and negative except as noted in the HPI.   Past Surgical History:  Procedure Laterality Date   ABDOMINAL HYSTERECTOMY     BREAST EXCISIONAL BIOPSY Left 1982   CESAREAN SECTION     KNEE SURGERY     NISSEN FUNDOPLICATION     PACEMAKER IMPLANT N/A 09/21/2019   Procedure: PACEMAKER  IMPLANT;  Surgeon: Melissa Lance, MD;  Location: [place] [facility];  Service: Cardiovascular;  Laterality: N/A;     Family History  Problem Relation Age of Onset   Breast cancer Sister    Breast cancer Cousin    Breast cancer Cousin      Social History   Socioeconomic History   Marital status: Married    Spouse name: Not on file   Number of children: Not on file   Years of education: Not on file   Highest education level: Not on file  Occupational History   Not on file  Tobacco Use   Smoking status: Every Day    Types: Cigarettes   Smokeless tobacco: Never  Substance and Sexual Activity   Alcohol use: Not on file   Drug use: Not on file   Sexual activity: Not on file  Other Topics Concern   Not on file  Social History Narrative   Not on file   Social Determinants of Health   Financial Resource Strain: Not  on file  Food Insecurity: Not on file  Transportation Needs: Not on file  Physical Activity: Not on file  Stress: Not on file  Social Connections: Not on file  Intimate Partner Violence: Not on file     BP 126/74   Pulse 100   Ht 5\' 1"  (1.549 m)   Wt 153 lb 3.2 oz (69.5 kg)   SpO2 98%   BMI 28.95 kg/m   Physical Exam:  Well appearing NAD HEENT: Unremarkable Neck:  No JVD, no thyromegally Lymphatics:  No adenopathy Back:  No CVA tenderness Lungs:  Clear with no wheezes HEART:  IRegular rate rhythm, no murmurs, no rubs, no clicks Abd:  soft, positive bowel sounds, no organomegally, no rebound, no guarding Ext:  2 plus pulses, no edema, no cyanosis, no clubbing Skin:  No rashes no nodules Neuro:  CN II through XII intact, motor grossly intact  EKG - atrial fib with a CVR  DEVICE  Normal device function.  See PaceArt for details.   Assess/Plan:  Atrial fib - her rates are not well controlled and I will ask her to take digoxin 0.125 mg daily, none on Sat/Sun. Coags - She will turn 76 in a few weeks. She will start Xarelto 20 mg daily. HTN - she denies having but her bp is not low despite toprol. Peripheral edema - this has improved since stopping cardizem.  Carleene Overlie Seda Kronberg,MD

## 2020-12-27 NOTE — Patient Instructions (Addendum)
Medication Instructions:  Your physician has recommended you make the following change in your medication:   1  START taking Xarelto 20 mg-  Take one tablet by mouth daily with your largest meal.  2.  Digoxin .125 mg -one tablet by mouth daily Monday through Friday.  Do NOT take on Saturday or Sunday.  Labwork: None ordered.  Testing/Procedures: None ordered.  Follow-Up: Your physician wants you to follow-up in: one year with Tommye Standard, PA-C  Remote monitoring is used to monitor your Pacemaker from home. This monitoring reduces the number of office visits required to check your device to one time per year. It allows [place] to keep an eye on the functioning of your device to ensure it is working properly. You are scheduled for a device check from home on 03/21/2021. You may send your transmission at any time that day. If you have a wireless device, the transmission will be sent automatically. After your physician reviews your transmission, you will receive a postcard with your next transmission date.  Any Other Special Instructions Will Be Listed Below (If Applicable).  If you need a refill on your cardiac medications before your next appointment, please call your pharmacy.   Digoxin Tablets What is this medication? DIGOXIN (di JOX in) treats heart failure. It may also be used to treat a type of arrhythmia known as AFib (atrial fibrillation). It works by helping your heart beat stronger, making it easier for your heart to pump blood to the rest of the body. It also slows down overactive electric signals in the heart, which stabilizes your heart rhythm. This medicine may be used for other purposes; ask your health care provider or pharmacist if you have questions. COMMON BRAND NAME(S): Digitek, Lanoxicaps, Lanoxin What should I tell my care team before I take this medication? They need to know if you have any of these conditions: Certain heart rhythm disorders Heart disease or recent heart  attack Kidney or liver disease An unusual or allergic reaction to digoxin, other medications, foods, dyes, or preservatives Pregnant or trying to get pregnant Breast-feeding How should I use this medication? Take this medication by mouth with a glass of water. Follow the directions on the prescription label. Take your doses at regular intervals. Do not take your medication more often than directed. Talk to your care team about the use of this medication in children. Special care may be needed. Overdosage: If you think you have taken too much of this medicine contact a poison control center or emergency room at once. NOTE: This medicine is only for you. Do not share this medicine with others. What if I miss a dose? If you miss a dose, take it as soon as you can. If it is almost time for your next dose, take only that dose. Do not take double or extra doses. What may interact with this medication? Activated charcoal Albuterol Alprazolam Antacids Antiviral medications for HIV or AIDS like ritonavir and saquinavir Calcium Certain antibiotics like azithromycin, clarithromycin, erythromycin, gentamicin, neomycin, trimethoprim, and tetracycline Certain medications for blood pressure, heart disease, irregular heart beat Certain medications for cancer Certain medications for cholesterol like atorvastatin, cholestyramine, and colestipol Certain medications for diabetes, like acarbose, exenatide, miglitol, and metformin Certain medications for fungal infections like ketoconazole and itraconazole Certain medications for stomach problems like omeprazole, esomeprazole, lansoprazole, rabeprazole, metoclopramide, and sucralfate Conivaptan Cyclosporine Diphenoxylate Epinephrine Kaolin; pectin Nefazodone NSAIDS, medications for pain and inflammation, like celecoxib, ibuprofen, or naproxen Penicillamine Phenytoin Propantheline Quinine Phenytoin Rifampin  Succinylcholine St. John's  Wort Sulfasalazine Teriparatide Thyroid hormones Tolvaptan This list may not describe all possible interactions. Give your health care provider a list of all the medicines, herbs, non-prescription drugs, or dietary supplements you use. Also tell them if you smoke, drink alcohol, or use illegal drugs. Some items may interact with your medicine. What should I watch for while using this medication? Visit your care team for regular checks on your progress. Do not stop taking this medication without the advice of your care team, even if you feel better. Do not change the brand you are taking, other brands may affect you differently. Check your heart rate and blood pressure regularly while you are taking this medication. Ask your care team what your heart rate and blood pressure should be, and when you should contact him or her. Your care team also may schedule regular blood tests and electrocardiograms to check your progress. Watch your diet. Less digoxin may be absorbed from the stomach if you have a diet high in bran fiber. Do not treat yourself for coughs, colds or allergies without asking your care team for advice. Some ingredients can increase possible side effects. What side effects may I notice from receiving this medication? Side effects that you should report to your care team as soon as possible: Allergic reactions--skin rash, itching, hives, swelling of the face, lips, tongue, or throat Digoxin toxicity--confusion, loss of appetite, nausea, vomiting, diarrhea, change in vision such as blurry or yellow vision, fatigue, fast or irregular heartbeat Slow heartbeat--dizziness, feeling faint or lightheaded, confusion, trouble breathing, unusual weakness or fatigue Side effects that usually do not require medical attention (report to your care team if they continue or are bothersome): Dizziness Stomach pain Unexpected breast tissue growth This list may not describe all possible side effects. Call  your doctor for medical advice about side effects. You may report side effects to FDA at 1-800-FDA-1088. Where should I keep my medication? Keep out of the reach of children. Store at room temperature between 15 and 30 degrees C (59 and 86 degrees F). Protect from light and moisture. Throw away any unused medication after the expiration date. NOTE: This sheet is a summary. It may not cover all possible information. If you have questions about this medicine, talk to your doctor, pharmacist, or health care provider.  2022 Elsevier/Gold Standard (2020-05-08 00:00:00)   Rivaroxaban Tablets What is this medication? RIVAROXABAN (ri va ROX a ban) prevents or treats blood clots. It is also used to lower the risk of stroke in people with AFib (atrial fibrillation). It can be used to lower the risk of heart attack or stroke in people with heart or peripheral artery disease. It belongs to a group of medications called blood thinners. This medicine may be used for other purposes; ask your health care provider or pharmacist if you have questions. COMMON BRAND NAME(S): Xarelto, Xarelto Starter Pack What should I tell my care team before I take this medication? They need to know if you have any of these conditions: Antiphospholipid antibody syndrome Bleeding disorders Bleeding in the brain Blood clots Kidney disease Liver disease Prosthetic heart valve Recent or planned spinal or epidural procedure Stomach bleeding Take medications that treat or prevent blood clots An unusual or allergic reaction to rivaroxaban, other medications, foods, dyes, or preservatives Pregnant or trying to get pregnant Breast-feeding How should I use this medication? Take this medication by mouth. For your therapy to work as well as possible, take each dose exactly as prescribed  on the prescription label. Do not skip doses. Skipping doses or stopping this medication can increase your risk of a blood clot or stroke. Keep  taking this medication unless your care team tells you to stop. If you are taking this medication after hip or knee replacement surgery, take it with or without food. If you are taking this medication for atrial fibrillation, take it with your evening meal. If you are taking this medication to treat blood clots, take it with food at the same time each day. If you are taking this medication for coronary artery disease or peripheral artery disease, take it with or without food at the same time every day. If you are unable to swallow your tablet, you may crush the tablet and mix it in applesauce. Then, immediately eat the applesauce. You should eat more food right after you eat the applesauce containing the crushed tablet. A special MedGuide will be given to you by the pharmacist with each prescription and refill. Be sure to read this information carefully each time. Talk to your care team about the use of this medication in children. While it may be prescribed for children as young as newborns for selected conditions, precautions do apply. Overdosage: If you think you have taken too much of this medicine contact a poison control center or emergency room at once. NOTE: This medicine is only for you. Do not share this medicine with others. What if I miss a dose? If you take your medication once a day and miss a dose, take it as soon as you can. If it is almost time for your next dose, take only that dose. Do not take double or extra doses. If you are taking this medication twice a day to treat blood clots and miss a dose, take the missed dose as soon as you remember. In this instance, 2 tablets may be taken at the same time. The next day you should take 1 tablet twice a day. If you are taking this medication twice a day for coronary artery disease or peripheral artery disease and miss a dose, skip it. Take your next dose at the normal time. Do not take extra or 2 doses at the same time to make up for the missed  dose. What may interact with this medication? Do not take this medication with any of the following: Defibrotide This medication may also interact with the following: Aspirin and aspirin-like medications Certain antibiotics like erythromycin and clarithromycin Certain medications for fungal infections like ketoconazole and itraconazole Certain medications for seizures like carbamazepine, phenytoin Certain medications that treat or prevent blood clots like warfarin, enoxaparin, dalteparin, apixaban, dabigatran, and edoxaban Conivaptan Indinavir Lopinavir; ritonavir NSAIDS, medications for pain and inflammation, like ibuprofen or naproxen Rifampin Ritonavir SNRIs, medications for depression, like desvenlafaxine, duloxetine, levomilnacipran, venlafaxine SSRIs, medications for depression, like citalopram, escitalopram, fluoxetine, fluvoxamine, paroxetine, sertraline St. John's wort This list may not describe all possible interactions. Give your health care provider a list of all the medicines, herbs, non-prescription drugs, or dietary supplements you use. Also tell them if you smoke, drink alcohol, or use illegal drugs. Some items may interact with your medicine. What should I watch for while using this medication? Visit your care team for regular checks on your progress. You may need blood work done while you are taking this medication. Your condition will be monitored carefully while you are receiving this medication. It is important not to miss any appointments. Avoid sports and activities that might cause injury  while you are using this medication. Severe falls or injuries can cause unseen bleeding. Be careful when using sharp tools or knives. Consider using an Copy. Take special care brushing or flossing your teeth. Report any injuries, bruising, or red spots on the skin to your care team. Wear a medical ID bracelet or chain. Carry a card that describes your condition. List the  medications and doses you take on the card. Tell your dentist and dental surgeon that you are taking this medication. You should not have major dental surgery while on this medication. See your dentist to have a dental exam and fix any dental problems before starting this medication. Take good care of your teeth while on this medication. Make sure you see your dentist for regular follow-up appointments. If you are going to need surgery or other procedure, tell your care team that you are using this medication. Do not become pregnant while taking this medication. Women should inform their care team if they wish to become pregnant or think they might be pregnant. There is potential for serious harm to an unborn child. Talk to your care team for more information. What side effects may I notice from receiving this medication? Side effects that you should report to your care team as soon as possible: Allergic reactions--skin rash, itching, hives, swelling of the face, lips, tongue, or throat Bleeding--bloody or black, tar-like stools, vomiting blood or brown material that looks like coffee grounds, red or dark brown urine, small red or purple spots on skin, unusual bruising or bleeding Bleeding in the brain--severe headache, stiff neck, confusion, dizziness, change in vision, numbness or weakness of the face, arm, or leg, trouble speaking, trouble walking, vomiting Heavy periods This list may not describe all possible side effects. Call your doctor for medical advice about side effects. You may report side effects to FDA at 1-800-FDA-1088. Where should I keep my medication? Keep out of the reach of children and pets. Store at room temperature between 20 and 25 degrees C (68 and 77 degrees F). Get rid of any unused medication after the expiration date. To get rid of medications that are no longer needed or have expired: Take the medication to a medication take-back program. Check your pharmacy or law  enforcement to find a location. If you cannot return the medication, check the label or package insert to see if the medication should be thrown out in the garbage or flushed down the toilet. If you are not sure, ask your care team. If it is safe to put it in the trash, empty the medication out of the container. Mix the medication with cat litter, dirt, coffee grounds, or other unwanted substance. Seal the mixture in a bag or container. Put it in the trash. NOTE: This sheet is a summary. It may not cover all possible information. If you have questions about this medicine, talk to your doctor, pharmacist, or health care provider.  2022 Elsevier/Gold Standard (2020-03-25 00:00:00)

## 2020-12-28 NOTE — [place] Notes (Signed)
Remote pacemaker transmission.   

## 2020-12-30 LAB — CUP PACEART INCLINIC DEVICE CHECK
Brady Statistic RV Percent Paced: 3 %
Date Time Interrogation Session: 20221115000000
Implantable Lead Implant Date: 20210809
Implantable Lead Location: 753860
Implantable Lead Model: 7842
Implantable Lead Serial Number: 1052152
Implantable Pulse Generator Implant Date: 20210809
Lead Channel Impedance Value: 732 Ohm
Lead Channel Pacing Threshold Amplitude: 0.5 V
Lead Channel Pacing Threshold Pulse Width: 1.3 ms
Lead Channel Sensing Intrinsic Amplitude: 15.2 mV
Lead Channel Setting Pacing Amplitude: 1.5 V
Lead Channel Setting Pacing Pulse Width: 0.4 ms
Lead Channel Setting Sensing Sensitivity: 2.5 mV
Pulse Gen Serial Number: 813574

## 2021-03-21 ENCOUNTER — Ambulatory Visit (INDEPENDENT_AMBULATORY_CARE_PROVIDER_SITE_OTHER): Payer: Medicare HMO

## 2021-03-21 DIAGNOSIS — I495 Sick sinus syndrome: Secondary | ICD-10-CM | POA: Diagnosis not present

## 2021-03-21 LAB — CUP PACEART REMOTE DEVICE CHECK
Battery Remaining Longevity: 96 mo
Battery Remaining Percentage: 100 %
Brady Statistic RV Percent Paced: 13 %
Date Time Interrogation Session: 20230207031300
Implantable Lead Implant Date: 20210809
Implantable Lead Location: 753860
Implantable Lead Model: 7842
Implantable Lead Serial Number: 1052152
Implantable Pulse Generator Implant Date: 20210809
Lead Channel Impedance Value: 728 Ohm
Lead Channel Pacing Threshold Amplitude: 0.8 V
Lead Channel Pacing Threshold Pulse Width: 0.4 ms
Lead Channel Setting Pacing Amplitude: 1.2 V
Lead Channel Setting Pacing Pulse Width: 0.4 ms
Lead Channel Setting Sensing Sensitivity: 2.5 mV
Pulse Gen Serial Number: 813574

## 2021-03-24 NOTE — [place] Notes (Signed)
Remote pacemaker transmission.   

## 2021-04-14 ENCOUNTER — Other Ambulatory Visit: Payer: Self-pay | Admitting: Internal Medicine

## 2021-06-20 ENCOUNTER — Ambulatory Visit (INDEPENDENT_AMBULATORY_CARE_PROVIDER_SITE_OTHER): Payer: Medicare HMO

## 2021-06-20 DIAGNOSIS — I495 Sick sinus syndrome: Secondary | ICD-10-CM | POA: Diagnosis not present

## 2021-06-20 LAB — CUP PACEART REMOTE DEVICE CHECK
Battery Remaining Longevity: 96 mo
Battery Remaining Percentage: 100 %
Brady Statistic RV Percent Paced: 20 %
Date Time Interrogation Session: 20230509031000
Implantable Lead Implant Date: 20210809
Implantable Lead Location: 753860
Implantable Lead Model: 7842
Implantable Lead Serial Number: 1052152
Implantable Pulse Generator Implant Date: 20210809
Lead Channel Impedance Value: 701 Ohm
Lead Channel Pacing Threshold Amplitude: 0.9 V
Lead Channel Pacing Threshold Pulse Width: 0.4 ms
Lead Channel Setting Pacing Amplitude: 1.3 V
Lead Channel Setting Pacing Pulse Width: 0.4 ms
Lead Channel Setting Sensing Sensitivity: 2.5 mV
Pulse Gen Serial Number: 813574

## 2021-06-28 DIAGNOSIS — F3340 Major depressive disorder, recurrent, in remission, unspecified: Secondary | ICD-10-CM | POA: Diagnosis not present

## 2021-06-28 DIAGNOSIS — Z950 Presence of cardiac pacemaker: Secondary | ICD-10-CM | POA: Diagnosis not present

## 2021-06-28 DIAGNOSIS — Z0000 Encounter for general adult medical examination without abnormal findings: Secondary | ICD-10-CM | POA: Diagnosis not present

## 2021-06-28 DIAGNOSIS — I700 Atherosclerosis of aorta: Secondary | ICD-10-CM | POA: Diagnosis not present

## 2021-06-28 DIAGNOSIS — Z1331 Encounter for screening for depression: Secondary | ICD-10-CM | POA: Diagnosis not present

## 2021-06-28 DIAGNOSIS — Z79899 Other long term (current) drug therapy: Secondary | ICD-10-CM | POA: Diagnosis not present

## 2021-06-28 DIAGNOSIS — F17211 Nicotine dependence, cigarettes, in remission: Secondary | ICD-10-CM | POA: Diagnosis not present

## 2021-06-28 DIAGNOSIS — I495 Sick sinus syndrome: Secondary | ICD-10-CM | POA: Diagnosis not present

## 2021-06-29 ENCOUNTER — Other Ambulatory Visit: Payer: Self-pay | Admitting: Geriatric Medicine

## 2021-06-29 DIAGNOSIS — Z0000 Encounter for general adult medical examination without abnormal findings: Secondary | ICD-10-CM

## 2021-06-29 DIAGNOSIS — F17209 Nicotine dependence, unspecified, with unspecified nicotine-induced disorders: Secondary | ICD-10-CM

## 2021-07-03 NOTE — [place] Notes (Signed)
Remote pacemaker transmission.   

## 2021-07-20 ENCOUNTER — Other Ambulatory Visit: Payer: Self-pay | Admitting: Geriatric Medicine

## 2021-07-20 DIAGNOSIS — Z1231 Encounter for screening mammogram for malignant neoplasm of breast: Secondary | ICD-10-CM

## 2021-08-03 ENCOUNTER — Ambulatory Visit
Admission: RE | Admit: 2021-08-03 | Discharge: 2021-08-03 | Disposition: A | Discharge status: Home / Self Care | Payer: Medicare HMO | Source: Ambulatory Visit | Attending: Geriatric Medicine | Admitting: Geriatric Medicine

## 2021-08-03 DIAGNOSIS — J432 Centrilobular emphysema: Secondary | ICD-10-CM | POA: Diagnosis not present

## 2021-08-03 DIAGNOSIS — I2510 Atherosclerotic heart disease of native coronary artery without angina pectoris: Secondary | ICD-10-CM | POA: Diagnosis not present

## 2021-08-03 DIAGNOSIS — Z87891 Personal history of nicotine dependence: Secondary | ICD-10-CM | POA: Diagnosis not present

## 2021-08-03 DIAGNOSIS — F17209 Nicotine dependence, unspecified, with unspecified nicotine-induced disorders: Secondary | ICD-10-CM

## 2021-08-03 DIAGNOSIS — Z0000 Encounter for general adult medical examination without abnormal findings: Secondary | ICD-10-CM

## 2021-08-03 DIAGNOSIS — E041 Nontoxic single thyroid nodule: Secondary | ICD-10-CM | POA: Diagnosis not present

## 2021-08-11 ENCOUNTER — Other Ambulatory Visit: Payer: Self-pay | Admitting: Geriatric Medicine

## 2021-08-11 DIAGNOSIS — R911 Solitary pulmonary nodule: Secondary | ICD-10-CM

## 2021-09-04 ENCOUNTER — Ambulatory Visit
Admission: RE | Admit: 2021-09-04 | Discharge: 2021-09-04 | Disposition: A | Discharge status: Home / Self Care | Payer: Medicare HMO | Source: Ambulatory Visit | Attending: Geriatric Medicine | Admitting: Geriatric Medicine

## 2021-09-04 DIAGNOSIS — Z1231 Encounter for screening mammogram for malignant neoplasm of breast: Secondary | ICD-10-CM

## 2021-09-11 ENCOUNTER — Other Ambulatory Visit: Payer: Medicare HMO

## 2021-09-19 ENCOUNTER — Ambulatory Visit (INDEPENDENT_AMBULATORY_CARE_PROVIDER_SITE_OTHER): Payer: Medicare HMO

## 2021-09-19 DIAGNOSIS — I495 Sick sinus syndrome: Secondary | ICD-10-CM | POA: Diagnosis not present

## 2021-09-19 LAB — CUP PACEART REMOTE DEVICE CHECK
Battery Remaining Longevity: 90 mo
Battery Remaining Percentage: 100 %
Brady Statistic RV Percent Paced: 26 %
Date Time Interrogation Session: 20230808031200
Implantable Lead Implant Date: 20210809
Implantable Lead Location: 753860
Implantable Lead Model: 7842
Implantable Lead Serial Number: 1052152
Implantable Pulse Generator Implant Date: 20210809
Lead Channel Impedance Value: 682 Ohm
Lead Channel Pacing Threshold Amplitude: 0.9 V
Lead Channel Pacing Threshold Pulse Width: 0.4 ms
Lead Channel Setting Pacing Amplitude: 1.5 V
Lead Channel Setting Pacing Pulse Width: 0.4 ms
Lead Channel Setting Sensing Sensitivity: 2.5 mV
Pulse Gen Serial Number: 813574

## 2021-10-16 DIAGNOSIS — H5213 Myopia, bilateral: Secondary | ICD-10-CM | POA: Diagnosis not present

## 2021-10-25 NOTE — [place] Notes (Signed)
Remote pacemaker transmission.   

## 2021-11-06 ENCOUNTER — Ambulatory Visit
Admission: RE | Admit: 2021-11-06 | Discharge: 2021-11-06 | Disposition: A | Discharge status: Home / Self Care | Payer: Medicare HMO | Source: Ambulatory Visit | Attending: Geriatric Medicine | Admitting: Geriatric Medicine

## 2021-11-06 DIAGNOSIS — J439 Emphysema, unspecified: Secondary | ICD-10-CM | POA: Diagnosis not present

## 2021-11-06 DIAGNOSIS — R911 Solitary pulmonary nodule: Secondary | ICD-10-CM

## 2021-11-06 DIAGNOSIS — I700 Atherosclerosis of aorta: Secondary | ICD-10-CM | POA: Diagnosis not present

## 2021-11-06 DIAGNOSIS — Z0100 Encounter for examination of eyes and vision without abnormal findings: Secondary | ICD-10-CM | POA: Diagnosis not present

## 2021-11-06 DIAGNOSIS — R918 Other nonspecific abnormal finding of lung field: Secondary | ICD-10-CM | POA: Diagnosis not present

## 2021-11-13 ENCOUNTER — Telehealth: Payer: Self-pay | Admitting: Internal Medicine

## 2021-11-13 NOTE — Telephone Encounter (Signed)
**Note De-Identified Reilley Valentine Obfuscation** The pt states that [facility] discounted cost of $85/30 day supply of Xarelto or $240 /90 day supply is not much better than what she is being charged now.  She states that she wants to call other pharmacies to check their cost for Xarelto and her insurance provider to see if she can get a better cost.  She states that if she has to, she feels that she can afford the current cost of Xarelto but would much rather is be less.  She states that she will send [place] a [facility] message to let us know where to send her Xarelto RX to fill.

## 2021-11-13 NOTE — Telephone Encounter (Signed)
Pt called to report that her Xarelto cost had gone up and she denies that she has reached her limit for the year... it went from 145.00  Per 90 days to 288.00 per 90 days... she said she talk to [place] and was told it was due to med cost changes.... she said this is now too expensive for her... she is saying she only has enough through his Thursday and plabs to change herself tro ASA... I offered her the number for pt assistance but she says she already knows she does not qualify... I advised her that I will forward to Dr Tanna Furry nurse and  our Prior Auth nurse to see of she qualifies for any further assistance.   Pt says she would rather not be changed to Coumadin unless her only choice.

## 2021-11-13 NOTE — Telephone Encounter (Signed)
Pt c/o medication issue:  1. Name of Medication: Xarelto  2. How are you currently taking this medication (dosage and times per day)?   3. Are you having a reaction (difficulty breathing--STAT)?   4. What is your medication issue? Can she change to another medicine, it is too expensivei

## 2021-11-22 DIAGNOSIS — L814 Other melanin hyperpigmentation: Secondary | ICD-10-CM | POA: Diagnosis not present

## 2021-11-22 DIAGNOSIS — L821 Other seborrheic keratosis: Secondary | ICD-10-CM | POA: Diagnosis not present

## 2021-11-22 DIAGNOSIS — D225 Melanocytic nevi of trunk: Secondary | ICD-10-CM | POA: Diagnosis not present

## 2021-11-22 DIAGNOSIS — C44529 Squamous cell carcinoma of skin of other part of trunk: Secondary | ICD-10-CM | POA: Diagnosis not present

## 2021-11-22 DIAGNOSIS — D492 Neoplasm of unspecified behavior of bone, soft tissue, and skin: Secondary | ICD-10-CM | POA: Diagnosis not present

## 2021-11-22 DIAGNOSIS — L570 Actinic keratosis: Secondary | ICD-10-CM | POA: Diagnosis not present

## 2021-12-19 ENCOUNTER — Ambulatory Visit (INDEPENDENT_AMBULATORY_CARE_PROVIDER_SITE_OTHER): Payer: Medicare HMO

## 2021-12-19 DIAGNOSIS — Z950 Presence of cardiac pacemaker: Secondary | ICD-10-CM

## 2021-12-19 DIAGNOSIS — I495 Sick sinus syndrome: Secondary | ICD-10-CM | POA: Diagnosis not present

## 2021-12-19 LAB — CUP PACEART REMOTE DEVICE CHECK
Battery Remaining Longevity: 90 mo
Battery Remaining Percentage: 100 %
Brady Statistic RV Percent Paced: 27 %
Date Time Interrogation Session: 20231107031100
Implantable Lead Connection Status: 753985
Implantable Lead Implant Date: 20210809
Implantable Lead Location: 753860
Implantable Lead Model: 7842
Implantable Lead Serial Number: 1052152
Implantable Pulse Generator Implant Date: 20210809
Lead Channel Impedance Value: 670 Ohm
Lead Channel Pacing Threshold Amplitude: 0.9 V
Lead Channel Pacing Threshold Pulse Width: 0.4 ms
Lead Channel Setting Pacing Amplitude: 1.5 V
Lead Channel Setting Pacing Pulse Width: 0.4 ms
Lead Channel Setting Sensing Sensitivity: 2.5 mV
Pulse Gen Serial Number: 813574
Zone Setting Status: 755011

## 2021-12-27 DIAGNOSIS — L570 Actinic keratosis: Secondary | ICD-10-CM | POA: Diagnosis not present

## 2021-12-27 DIAGNOSIS — C44529 Squamous cell carcinoma of skin of other part of trunk: Secondary | ICD-10-CM | POA: Diagnosis not present

## 2022-01-15 NOTE — [place] Notes (Signed)
Remote pacemaker transmission.   

## 2022-01-17 DIAGNOSIS — J019 Acute sinusitis, unspecified: Secondary | ICD-10-CM | POA: Diagnosis not present

## 2022-01-17 DIAGNOSIS — R0981 Nasal congestion: Secondary | ICD-10-CM | POA: Diagnosis not present

## 2022-01-17 DIAGNOSIS — R051 Acute cough: Secondary | ICD-10-CM | POA: Diagnosis not present

## 2022-01-17 DIAGNOSIS — J209 Acute bronchitis, unspecified: Secondary | ICD-10-CM | POA: Diagnosis not present

## 2022-01-17 DIAGNOSIS — R519 Headache, unspecified: Secondary | ICD-10-CM | POA: Diagnosis not present

## 2022-02-14 ENCOUNTER — Other Ambulatory Visit: Payer: Self-pay | Admitting: Internal Medicine

## 2022-02-14 DIAGNOSIS — I480 Paroxysmal atrial fibrillation: Secondary | ICD-10-CM

## 2022-02-14 NOTE — Telephone Encounter (Signed)
Prescription refill request for Xarelto received.  Indication: Afib  Last office visit: 12/27/20 Lovena Le)  Weight: 69.5kg Age: 77 Scr: 0.63 (06/28/21 via [place])  CrCl: 84.75m/min  Overdue to see cardiologist.

## 2022-02-15 NOTE — Telephone Encounter (Signed)
Called and spoke with pt. Made her aware that a scheduled appt is required prior to future refills. Pt verbalized understanding. Transferred pt to scheduling.

## 2022-02-15 NOTE — Telephone Encounter (Signed)
Pt has scheduled appt with Dr Lovena Le on 03/20/22. Refill sent.

## 2022-03-20 ENCOUNTER — Ambulatory Visit: Payer: Medicare HMO

## 2022-03-20 ENCOUNTER — Ambulatory Visit (INDEPENDENT_AMBULATORY_CARE_PROVIDER_SITE_OTHER): Payer: Medicare HMO | Admitting: Internal Medicine

## 2022-03-20 ENCOUNTER — Encounter: Payer: Self-pay | Admitting: Internal Medicine

## 2022-03-20 VITALS — BP 122/70 | HR 86 | Ht 61.0 in | Wt 145.8 lb

## 2022-03-20 DIAGNOSIS — I4892 Unspecified atrial flutter: Secondary | ICD-10-CM

## 2022-03-20 DIAGNOSIS — I495 Sick sinus syndrome: Secondary | ICD-10-CM | POA: Diagnosis not present

## 2022-03-20 DIAGNOSIS — I10 Essential (primary) hypertension: Secondary | ICD-10-CM | POA: Diagnosis not present

## 2022-03-20 DIAGNOSIS — Z950 Presence of cardiac pacemaker: Secondary | ICD-10-CM

## 2022-03-20 LAB — CUP PACEART REMOTE DEVICE CHECK
Battery Remaining Longevity: 84 mo
Battery Remaining Percentage: 100 %
Brady Statistic RV Percent Paced: 27 %
Date Time Interrogation Session: 20240206031200
Implantable Lead Connection Status: 753985
Implantable Lead Implant Date: 20210809
Implantable Lead Location: 753860
Implantable Lead Model: 7842
Implantable Lead Serial Number: 1052152
Implantable Pulse Generator Implant Date: 20210809
Lead Channel Impedance Value: 712 Ohm
Lead Channel Pacing Threshold Amplitude: 0.9 V
Lead Channel Pacing Threshold Pulse Width: 0.4 ms
Lead Channel Setting Pacing Amplitude: 1.4 V
Lead Channel Setting Pacing Pulse Width: 0.4 ms
Lead Channel Setting Sensing Sensitivity: 2.5 mV
Pulse Gen Serial Number: 813574
Zone Setting Status: 755011

## 2022-03-20 NOTE — Progress Notes (Signed)
HPI [name] returns today for followup. She has developed atrial fib with a RVR and a slow VR and symptomatic bradycardia and underwent PPM insertion. She has had problems with peripheral edema but better lately. She has not had syncope. She denies chest pain or sob. She has had some sodium indiscretion. Her rates are not well controlled.   No Known Allergies   Current Outpatient Medications  Medication Sig Dispense Refill   ALPRAZolam (XANAX) 0.5 MG tablet Take 0.5 mg by mouth 2 (two) times daily as needed for anxiety.      ascorbic acid (VITAMIN C) 500 MG tablet Take 500 mg by mouth daily.     Calcium Carbonate-Vitamin D (CALCIUM 600 + D PO) Take 1 tablet by mouth at bedtime.      digoxin (LANOXIN) 0.125 MG tablet TAKE ONE TABLET DAILY MONDAY THROUGH FRIDAY. DO NOT TAKE SATURDAY OR SUNDAY. 25 tablet 0   loratadine (CLARITIN) 10 MG tablet Take 10 mg by mouth daily.     LYSINE PO Take 1 tablet by mouth daily.      metoprolol succinate (TOPROL-XL) 100 MG 24 hr tablet Take 50 mg by mouth daily. Take with or immediately following a meal.     Multiple Vitamin (MULTIVITAMIN) tablet Take 1 tablet by mouth daily.     predniSONE (DELTASONE) 10 MG tablet Take 1 tablet by mouth daily as needed (inflammation).     rivaroxaban (XARELTO) 20 MG TABS tablet TAKE 1 TABLET EVERY DAY WITH SUPPER 90 tablet 1   valACYclovir (VALTREX) 1000 MG tablet Take 1,000 mg by mouth 2 (two) times daily as needed (fever blisters).      No current facility-administered medications for this visit.     Past Medical History:  Diagnosis Date   Allergic rhinitis    Anxiety    ATRIAL FLUTTER 05/04/2009   Qualifier: Diagnosis of  By: Lovena Le, MD, Martyn Malay    DDD (degenerative disc disease)    a. L5-S1, MRI 05/2006   Depression    Dupuytren's contracture    Essential hypertension, benign 05/04/2009   Qualifier: Diagnosis of  By: Lovena Le, MD, [facility], Binnie Kand    HTN (hypertension)    Left knee  pain 02/08/2014   Obesity    Osteoarthritis    a. knees   Palpitations    Paroxysmal atrial flutter (HCC)    PMR (polymyalgia rheumatica) (HCC)    Rupture of anterior cruciate ligament of left knee 02/08/2014   SUPRAVENTRICULAR TACHYCARDIA 05/03/2009   Qualifier: Diagnosis of  By: Selena Batten CMA, [place]     Thyroid nodule    a. negative bx 2001 - right lobe   Vitamin D deficiency     ROS:   All systems reviewed and negative except as noted in the HPI.   Past Surgical History:  Procedure Laterality Date   ABDOMINAL HYSTERECTOMY     BREAST EXCISIONAL BIOPSY Left 1982   CESAREAN SECTION     KNEE SURGERY     NISSEN FUNDOPLICATION     PACEMAKER IMPLANT N/A 09/21/2019   Procedure: PACEMAKER IMPLANT;  Surgeon: Evans Lance, MD;  Location: [facility];  Service: Cardiovascular;  Laterality: N/A;     Family History  Problem Relation Age of Onset   Breast cancer Sister    Breast cancer Cousin    Breast cancer Cousin      Social History   Socioeconomic History   Marital status: Married    Spouse name:  Not on file   Number of children: Not on file   Years of education: Not on file   Highest education level: Not on file  Occupational History   Not on file  Tobacco Use   Smoking status: Every Day    Types: Cigarettes   Smokeless tobacco: Never  Substance and Sexual Activity   Alcohol use: Not on file   Drug use: Not on file   Sexual activity: Not on file  Other Topics Concern   Not on file  Social History Narrative   Not on file   Social Determinants of Health   Financial Resource Strain: Not on file  Food Insecurity: Not on file  Transportation Needs: Not on file  Physical Activity: Not on file  Stress: Not on file  Social Connections: Not on file  Intimate Partner Violence: Not on file     BP 122/70   Pulse 86   Ht '5\' 1"'$  (1.549 m)   Wt 145 lb 12.8 oz (66.1 kg)   SpO2 98%   BMI 27.55 kg/m   Physical Exam:  Well appearing NAD HEENT:  Unremarkable Neck:  No JVD, no thyromegally Lymphatics:  No adenopathy Back:  No CVA tenderness Lungs:  Clear with no wheezes HEART:  Regular rate rhythm, no murmurs, no rubs, no clicks Abd:  soft, positive bowel sounds, no organomegally, no rebound, no guarding Ext:  2 plus pulses, no edema, no cyanosis, no clubbing Skin:  No rashes no nodules Neuro:  CN II through XII intact, motor grossly intact  EKG - atrial fib with a controlled VR  DEVICE  Normal device function.  See PaceArt for details.   Assess/Plan:  Atrial fib - her rates are not well controlled. She will continue dig and toprol. Coags - She will continue Xarelto 20 mg daily. HTN - she denies having but her bp is not low despite toprol. Peripheral edema - this has improved since stopping cardizem.   Melissa Finley Melissa Golden,MD

## 2022-03-20 NOTE — Patient Instructions (Signed)
Medication Instructions:  Your physician recommends that you continue on your current medications as directed. Please refer to the Current Medication list given to you today.  *If you need a refill on your cardiac medications before your next appointment, please call your pharmacy*  Lab Work: None ordered.  If you have labs (blood work) drawn today and your tests are completely normal, you will receive your results only by: [place] (if you have MyChart) OR A paper copy in the mail If you have any lab test that is abnormal or we need to change your treatment, we will call you to review the results.  Testing/Procedures: None ordered.  Follow-Up: At [facility], you and your health needs are our priority.  As part of our continuing mission to provide you with exceptional heart care, we have created designated Provider Care Teams.  These Care Teams include your primary Cardiologist (physician) and Advanced Practice Providers (APPs -  Physician Assistants and Nurse Practitioners) who all work together to provide you with the care you need, when you need it.  We recommend signing up for the patient portal called "MyChart".  Sign up information is provided on this After Visit Summary.  MyChart is used to connect with patients for Virtual Visits (Telemedicine).  Patients are able to view lab/test results, encounter notes, upcoming appointments, etc.  Non-urgent messages can be sent to your provider as well.   To learn more about what you can do with MyChart, go to [email or web address].    Your next appointment:   1 year(s)  The format for your next appointment:   In Person  Provider:   Cristopher Peru, MD{or one of the following Advanced Practice Providers on your designated Care Team:   Tommye Standard, [place] Legrand Como "Jonni Sanger" Chalmers Cater, [place]  Remote monitoring is used to monitor your Pacemaker. This monitoring reduces the number of office visits required to check your device to one time  per year. It allows [place] to keep an eye on the functioning of your device to ensure it is working properly. You are scheduled for a device check from home on 06/19/22. You may send your transmission at any time that day. If you have a wireless device, the transmission will be sent automatically. After your physician reviews your transmission, you will receive a postcard with your next transmission date.  Important Information About Sugar

## 2022-04-16 ENCOUNTER — Other Ambulatory Visit: Payer: Self-pay | Admitting: Internal Medicine

## 2022-04-16 DIAGNOSIS — R911 Solitary pulmonary nodule: Secondary | ICD-10-CM

## 2022-04-24 NOTE — [place] Notes (Signed)
Remote pacemaker transmission.   

## 2022-05-11 ENCOUNTER — Other Ambulatory Visit: Payer: Medicare HMO

## 2022-05-16 ENCOUNTER — Other Ambulatory Visit: Payer: Self-pay | Admitting: Internal Medicine

## 2022-06-11 ENCOUNTER — Ambulatory Visit
Admission: RE | Admit: 2022-06-11 | Discharge: 2022-06-11 | Disposition: A | Discharge status: Home / Self Care | Payer: Medicare HMO | Source: Ambulatory Visit | Attending: Internal Medicine | Admitting: Internal Medicine

## 2022-06-11 DIAGNOSIS — R911 Solitary pulmonary nodule: Secondary | ICD-10-CM

## 2022-06-19 ENCOUNTER — Ambulatory Visit (INDEPENDENT_AMBULATORY_CARE_PROVIDER_SITE_OTHER): Payer: Medicare HMO

## 2022-06-19 DIAGNOSIS — I495 Sick sinus syndrome: Secondary | ICD-10-CM

## 2022-06-19 LAB — CUP PACEART REMOTE DEVICE CHECK
Battery Remaining Longevity: 84 mo
Battery Remaining Percentage: 100 %
Brady Statistic RV Percent Paced: 32 %
Date Time Interrogation Session: 20240507031000
Implantable Lead Connection Status: 753985
Implantable Lead Implant Date: 20210809
Implantable Lead Location: 753860
Implantable Lead Model: 7842
Implantable Lead Serial Number: 1052152
Implantable Pulse Generator Implant Date: 20210809
Lead Channel Impedance Value: 680 Ohm
Lead Channel Pacing Threshold Amplitude: 0.9 V
Lead Channel Pacing Threshold Pulse Width: 0.4 ms
Lead Channel Setting Pacing Amplitude: 1.4 V
Lead Channel Setting Pacing Pulse Width: 0.4 ms
Lead Channel Setting Sensing Sensitivity: 2.5 mV
Pulse Gen Serial Number: 813574
Zone Setting Status: 755011

## 2022-07-11 NOTE — [place] Notes (Signed)
Remote pacemaker transmission.   

## 2022-08-03 ENCOUNTER — Other Ambulatory Visit: Payer: Self-pay | Admitting: Internal Medicine

## 2022-08-03 DIAGNOSIS — I480 Paroxysmal atrial fibrillation: Secondary | ICD-10-CM

## 2022-08-06 NOTE — Telephone Encounter (Signed)
Prescription refill request for Xarelto received.  Indication:afib Last office visit:2/24 Weight:66.1  kg Age:77 Scr:0.6  2023 CrCl:83.24  ml/min  Prescription refilled

## 2022-08-24 ENCOUNTER — Other Ambulatory Visit: Payer: Self-pay | Admitting: Internal Medicine

## 2022-08-24 DIAGNOSIS — Z1231 Encounter for screening mammogram for malignant neoplasm of breast: Secondary | ICD-10-CM

## 2022-09-07 ENCOUNTER — Ambulatory Visit
Admission: RE | Admit: 2022-09-07 | Discharge: 2022-09-07 | Disposition: A | Discharge status: Home / Self Care | Payer: Medicare HMO | Source: Ambulatory Visit | Attending: Internal Medicine | Admitting: Internal Medicine

## 2022-09-07 DIAGNOSIS — Z1231 Encounter for screening mammogram for malignant neoplasm of breast: Secondary | ICD-10-CM

## 2022-09-18 ENCOUNTER — Ambulatory Visit (INDEPENDENT_AMBULATORY_CARE_PROVIDER_SITE_OTHER): Payer: Medicare HMO

## 2022-09-18 DIAGNOSIS — I495 Sick sinus syndrome: Secondary | ICD-10-CM

## 2022-09-24 DIAGNOSIS — Z79899 Other long term (current) drug therapy: Secondary | ICD-10-CM | POA: Diagnosis not present

## 2022-09-24 DIAGNOSIS — D6869 Other thrombophilia: Secondary | ICD-10-CM | POA: Diagnosis not present

## 2022-09-24 DIAGNOSIS — E7800 Pure hypercholesterolemia, unspecified: Secondary | ICD-10-CM | POA: Diagnosis not present

## 2022-09-24 DIAGNOSIS — Z0000 Encounter for general adult medical examination without abnormal findings: Secondary | ICD-10-CM | POA: Diagnosis not present

## 2022-09-24 DIAGNOSIS — J439 Emphysema, unspecified: Secondary | ICD-10-CM | POA: Diagnosis not present

## 2022-09-24 DIAGNOSIS — F439 Reaction to severe stress, unspecified: Secondary | ICD-10-CM | POA: Diagnosis not present

## 2022-09-24 DIAGNOSIS — R911 Solitary pulmonary nodule: Secondary | ICD-10-CM | POA: Diagnosis not present

## 2022-09-24 DIAGNOSIS — I700 Atherosclerosis of aorta: Secondary | ICD-10-CM | POA: Diagnosis not present

## 2022-09-24 DIAGNOSIS — F3340 Major depressive disorder, recurrent, in remission, unspecified: Secondary | ICD-10-CM | POA: Diagnosis not present

## 2022-09-24 DIAGNOSIS — Z1331 Encounter for screening for depression: Secondary | ICD-10-CM | POA: Diagnosis not present

## 2022-09-24 DIAGNOSIS — D7589 Other specified diseases of blood and blood-forming organs: Secondary | ICD-10-CM | POA: Diagnosis not present

## 2022-09-24 DIAGNOSIS — I480 Paroxysmal atrial fibrillation: Secondary | ICD-10-CM | POA: Diagnosis not present

## 2022-10-04 NOTE — [place] Notes (Signed)
Remote pacemaker transmission.   

## 2022-10-23 DIAGNOSIS — Z961 Presence of intraocular lens: Secondary | ICD-10-CM | POA: Diagnosis not present

## 2022-10-23 DIAGNOSIS — H524 Presbyopia: Secondary | ICD-10-CM | POA: Diagnosis not present

## 2022-10-23 DIAGNOSIS — H43813 Vitreous degeneration, bilateral: Secondary | ICD-10-CM | POA: Diagnosis not present

## 2022-10-23 DIAGNOSIS — H353131 Nonexudative age-related macular degeneration, bilateral, early dry stage: Secondary | ICD-10-CM | POA: Diagnosis not present

## 2022-10-23 DIAGNOSIS — H04123 Dry eye syndrome of bilateral lacrimal glands: Secondary | ICD-10-CM | POA: Diagnosis not present

## 2022-11-10 ENCOUNTER — Other Ambulatory Visit: Payer: Self-pay | Admitting: Internal Medicine

## 2022-11-10 DIAGNOSIS — M8580 Other specified disorders of bone density and structure, unspecified site: Secondary | ICD-10-CM

## 2022-11-27 ENCOUNTER — Telehealth: Payer: Self-pay | Admitting: Internal Medicine

## 2022-11-27 DIAGNOSIS — Z79899 Other long term (current) drug therapy: Secondary | ICD-10-CM

## 2022-11-27 NOTE — Telephone Encounter (Signed)
Returned call to patient who states she has hit donut hole and cannot afford her Xarelto. She has 6 pills left. I discussed the [place] plan where she can pay $89/30 days, but she still feels this is too expensive (was paying $125/90dys). She is requesting to try generic Pradaxa as this shows on her insurance book that it should be cheaper. Advised I could speak to Dr Ladona Ridgel about this, but not sure we can get her an answer in 6 days. Asked that if she can afford it, she fill the Xarelto for 1 month only. She states she will think about it. Routing to PharmD for Pradaxa dosing and MD for approval.

## 2022-11-27 NOTE — Telephone Encounter (Signed)
Pt c/o medication issue:  1. Name of Medication: XARELTO 20 MG TABS tablet   2. How are you currently taking this medication (dosage and times per day)? As written   3. Are you having a reaction (difficulty breathing--STAT)? No   4. What is your medication issue? Pt called in stating this medication went from $125 to $400. She is unable to afford. Please advise of other options and maybe she can get some samples because she is in need of a refill.

## 2022-11-27 NOTE — Telephone Encounter (Signed)
Agree that signing up for [place] and [place] Xarelto savings plan is her best option.  Pradaxa is renally dosed and last serum creatinine is from 06/28/21. Recommend updating before dosing.

## 2022-11-30 NOTE — Telephone Encounter (Signed)
Returned call to patient who states that she "just had labs done at [place]." I cannot find record of this in the chart, including media tab. She will see if she can pull up on phone and send to [place] via MyChart. Left coupon up front for 30 days free of Xarelto. BMET entered at this time and she will plan to come Monday 10/21 if she cannot get [place] copy. Then Pradaxa can be dosed. She still wishes to switch for cost savings.

## 2022-12-03 ENCOUNTER — Ambulatory Visit: Payer: Medicare HMO | Attending: Internal Medicine

## 2022-12-03 ENCOUNTER — Telehealth: Payer: Self-pay | Admitting: Internal Medicine

## 2022-12-03 DIAGNOSIS — Z79899 Other long term (current) drug therapy: Secondary | ICD-10-CM | POA: Diagnosis not present

## 2022-12-03 DIAGNOSIS — I480 Paroxysmal atrial fibrillation: Secondary | ICD-10-CM

## 2022-12-03 MED ORDER — RIVAROXABAN 20 MG PO TABS: 20.0000 mg | ORAL_TABLET | Freq: Every day | ORAL | 1 refills | Status: DC

## 2022-12-03 NOTE — Telephone Encounter (Signed)
*  STAT* If patient is at the pharmacy, call can be transferred to refill team.   1. Which medications need to be refilled? (please list name of each medication and dose if known)  XARELTO 20 MG TABS tablet   2. Would you like to learn more about the convenience, safety, & potential cost savings by using the [facility]? No   3. Are you open to using the [facility] No   4. Which pharmacy/location (including street and city if local pharmacy) is medication to be sent to? [facility] [address], [place], [place] [address]   5. Do they need a 30 day or 90 day supply? 90 Day Supply  Pt is completely out.

## 2022-12-03 NOTE — Telephone Encounter (Signed)
Prescription refill request for Xarelto received.  Indication: A Flutter Last office visit: 03/20/22  Rosette Reveal MD Weight: 66.1kg Age: 77 Scr: 0.63 on 06/28/21  New labs pending CrCl: 79.27  Based on above findings Xarelto 20mg  daily is the appropriate dose.  Refill approved.

## 2022-12-04 LAB — BASIC METABOLIC PANEL
BUN/Creatinine Ratio: 9 — ABNORMAL LOW (ref 12–28)
BUN: 6 mg/dL — ABNORMAL LOW (ref 8–27)
CO2: 23 mmol/L (ref 20–29)
Calcium: 9.5 mg/dL (ref 8.7–10.3)
Chloride: 104 mmol/L (ref 96–106)
Creatinine, Ser: 0.64 mg/dL (ref 0.57–1.00)
Glucose: 95 mg/dL (ref 70–99)
Potassium: 4.1 mmol/L (ref 3.5–5.2)
Sodium: 144 mmol/L (ref 134–144)
eGFR: 92 mL/min/{1.73_m2} (ref >59)

## 2022-12-05 ENCOUNTER — Other Ambulatory Visit (HOSPITAL_COMMUNITY): Payer: Self-pay

## 2022-12-05 NOTE — Telephone Encounter (Signed)
Pt c/o medication issue:  1. Name of Medication:   rivaroxaban (XARELTO) 20 MG TABS tablet   2. How are you currently taking this medication (dosage and times per day)?   3. Are you having a reaction (difficulty breathing--STAT)?   4. What is your medication issue?    Patient stated she has not been able to get this medication as the pharmacies were not carrying this medication or it was on back order as it is very expensive.  Patient stated she was able to find the medication at the [facility] in [place] but the only have this in 10 mg or 15 mg dosages.  Patient stated she has been out of this medication for the last four days.  Patient stated she did complete her lab work.  Patient wants call back to discuss next steps.

## 2022-12-06 ENCOUNTER — Other Ambulatory Visit (HOSPITAL_COMMUNITY): Payer: Self-pay

## 2022-12-06 MED ORDER — RIVAROXABAN 20 MG PO TABS: 20.0000 mg | ORAL_TABLET | Freq: Every day | ORAL | 11 refills | Status: DC

## 2022-12-06 NOTE — Addendum Note (Signed)
Addended by: Frutoso Schatz on: 12/06/2022 03:55 PM   Modules accepted: Orders

## 2022-12-13 ENCOUNTER — Other Ambulatory Visit: Payer: Self-pay

## 2022-12-13 MED ORDER — DABIGATRAN ETEXILATE MESYLATE 150 MG PO CAPS: 150.0000 mg | ORAL_CAPSULE | Freq: Two times a day (BID) | ORAL | 3 refills | Status: DC

## 2022-12-18 ENCOUNTER — Ambulatory Visit (INDEPENDENT_AMBULATORY_CARE_PROVIDER_SITE_OTHER): Payer: Medicare HMO

## 2022-12-18 DIAGNOSIS — I480 Paroxysmal atrial fibrillation: Secondary | ICD-10-CM | POA: Diagnosis not present

## 2022-12-18 DIAGNOSIS — I495 Sick sinus syndrome: Secondary | ICD-10-CM

## 2022-12-18 LAB — CUP PACEART REMOTE DEVICE CHECK
Battery Remaining Longevity: 78 mo
Battery Remaining Percentage: 93 %
Brady Statistic RV Percent Paced: 33 %
Date Time Interrogation Session: 20241105031100
Implantable Lead Connection Status: 753985
Implantable Lead Implant Date: 20210809
Implantable Lead Location: 753860
Implantable Lead Model: 7842
Implantable Lead Serial Number: 1052152
Implantable Pulse Generator Implant Date: 20210809
Lead Channel Impedance Value: 692 Ohm
Lead Channel Pacing Threshold Amplitude: 0.8 V
Lead Channel Pacing Threshold Pulse Width: 0.4 ms
Lead Channel Setting Pacing Amplitude: 1.3 V
Lead Channel Setting Pacing Pulse Width: 0.4 ms
Lead Channel Setting Sensing Sensitivity: 2.5 mV
Pulse Gen Serial Number: 813574
Zone Setting Status: 755011

## 2023-01-03 DIAGNOSIS — L728 Other follicular cysts of the skin and subcutaneous tissue: Secondary | ICD-10-CM | POA: Diagnosis not present

## 2023-01-03 DIAGNOSIS — D225 Melanocytic nevi of trunk: Secondary | ICD-10-CM | POA: Diagnosis not present

## 2023-01-03 DIAGNOSIS — L814 Other melanin hyperpigmentation: Secondary | ICD-10-CM | POA: Diagnosis not present

## 2023-01-03 DIAGNOSIS — Z08 Encounter for follow-up examination after completed treatment for malignant neoplasm: Secondary | ICD-10-CM | POA: Diagnosis not present

## 2023-01-03 DIAGNOSIS — Z872 Personal history of diseases of the skin and subcutaneous tissue: Secondary | ICD-10-CM | POA: Diagnosis not present

## 2023-01-03 DIAGNOSIS — L821 Other seborrheic keratosis: Secondary | ICD-10-CM | POA: Diagnosis not present

## 2023-01-03 DIAGNOSIS — Z85828 Personal history of other malignant neoplasm of skin: Secondary | ICD-10-CM | POA: Diagnosis not present

## 2023-01-15 NOTE — [place] Notes (Signed)
Remote pacemaker transmission.   

## 2023-03-09 NOTE — Progress Notes (Unsigned)
  Cardiology Office Note:  .   Date:  03/09/2023  ID:  Melissa Finley, DOB 1945-06-06, MRN [id number] PCP: Merlene Laughter, MD (Inactive)  [facility] Providers Cardiologist:  None {  History of Present Illness: .   Melissa Finley is a 78 y.o. female w/PMHx of AFib (permanent), tachy-brady w/PPM, HTN, OA, PMR  Saw Dr. Ladona Ridgel 03/20/22, feeling well though HRs poorly controlled, her dig and BB continued, as well as her OAC Edema better off CCB  Today's visit is scheduled as an annual visit  ROS:   She is doing well Sedentary Mentions she got a bit depressed last year, had the "blues" over the holidays Has discussed with her PMD  No CP, SOB Doesn't exercise but no difficulties with her ADLs No near syncope or syncope. Infrequently feels like she is a bit lightheaded No palpitations but on occasion feels like she gets a sudden drop sensation in her heartbeat  No bleeding or signs of bleeding but reports the pradaxa gives her heartburn Xarelto had become cost prohibitive She has never looked into Eliquis Warfarin not ideal/or very desirable to her    Device information [employer]i single chamber PPM implanted 09/21/2019   Arrhythmia/AAD hx AFib, descried as persistent/permanent at time of PPM implant  Studies Reviewed: Marland Kitchen    EKG done today and reviewed by myself:  AFib 70bpm, some V paced beats  DEVICE interrogation done today and reviewed by myself Battery and lead measurements are good Est 6 years remaining RV pacing 35% Rate controlled  2013: TTE - Left ventricle: The cavity size was normal. Wall thickness    was increased in a pattern of mild LVH. Systolic function    was normal. The estimated ejection fraction was in the    range of 55% to 65%. Wall motion was normal; there were no    regional wall motion abnormalities. Doppler parameters are    consistent with abnormal left ventricular relaxation    (grade 1 diastolic dysfunction).  - Atrial septum: No  defect or patent foramen ovale was    identified.    Risk Assessment/Calculations:    Physical Exam:   VS:  There were no vitals taken for this visit.   Wt Readings from Last 3 Encounters:  03/20/22 145 lb 12.8 oz (66.1 kg)  12/27/20 153 lb 3.2 oz (69.5 kg)  12/22/19 152 lb 12.8 oz (69.3 kg)    GEN: Well nourished, well developed in no acute distress NECK: No JVD; No carotid bruits CARDIAC: irreg-irreg, no murmurs, rubs, gallops RESPIRATORY:  CTA b/l without rales, wheezing or rhonchi  ABDOMEN: Soft, non-tender, non-distended EXTREMITIES:  No edema; No deformity   PPM site: is stable, no thinning, fluctuation, tethering  ASSESSMENT AND PLAN: .    Permanent  AFib CHA2DS2Vasc is 4, on pradaxa, appropriately dosed controlled rate  Needs dig level She is going to look into Eliquis cost for her Let us know if she wants to change to eliquis, or try warfarin  PPM intact function no programming changs made Not dependent 6 years estimated battery longevity Implanted 2021  HTN Looks OK  Secondary hypercoagulable state 2/2 AFib     Dispo: back again in a year, sooner if needed   Signed, Sheilah Pigeon, PA-C

## 2023-03-12 ENCOUNTER — Ambulatory Visit: Payer: Medicare HMO | Attending: Physician Assistant | Admitting: Physician Assistant

## 2023-03-12 VITALS — BP 120/72 | HR 70 | Ht 61.0 in | Wt 155.0 lb

## 2023-03-12 DIAGNOSIS — I495 Sick sinus syndrome: Secondary | ICD-10-CM | POA: Diagnosis not present

## 2023-03-12 DIAGNOSIS — I10 Essential (primary) hypertension: Secondary | ICD-10-CM | POA: Diagnosis not present

## 2023-03-12 DIAGNOSIS — D6869 Other thrombophilia: Secondary | ICD-10-CM

## 2023-03-12 DIAGNOSIS — Z79899 Other long term (current) drug therapy: Secondary | ICD-10-CM

## 2023-03-12 DIAGNOSIS — I4821 Permanent atrial fibrillation: Secondary | ICD-10-CM

## 2023-03-12 DIAGNOSIS — Z950 Presence of cardiac pacemaker: Secondary | ICD-10-CM

## 2023-03-12 LAB — CUP PACEART INCLINIC DEVICE CHECK
Date Time Interrogation Session: 20250128135721
Implantable Lead Connection Status: 753985
Implantable Lead Implant Date: 20210809
Implantable Lead Location: 753860
Implantable Lead Model: 7842
Implantable Lead Serial Number: 1052152
Implantable Pulse Generator Implant Date: 20210809
Lead Channel Impedance Value: 697 Ohm
Lead Channel Pacing Threshold Amplitude: 0.7 V
Lead Channel Pacing Threshold Pulse Width: 0.4 ms
Lead Channel Sensing Intrinsic Amplitude: 9 mV
Lead Channel Setting Pacing Amplitude: 1.4 V
Lead Channel Setting Pacing Pulse Width: 0.4 ms
Lead Channel Setting Sensing Sensitivity: 2.5 mV
Pulse Gen Serial Number: 813574
Zone Setting Status: 755011

## 2023-03-12 NOTE — Patient Instructions (Signed)
Medication Instructions:   Your physician recommends that you continue on your current medications as directed. Please refer to the Current Medication list given to you today.   *If you need a refill on your cardiac medications before your next appointment, please call your pharmacy*   Lab Work:   PLEASE GO DOWN STAIRS  [facility] FIRST FLOOR  [address] :   DIGOXIN LEVEL   (RETURN ON A DAY  YOU HAVEN'T TOOKEN YOUR DIGOXIN)    If you have labs (blood work) drawn today and your tests are completely normal, you will receive your results only by: MyChart Message (if you have MyChart) OR A paper copy in the mail If you have any lab test that is abnormal or we need to change your treatment, we will call you to review the results.   Testing/Procedures: NONE ORDERED  TODAY      Follow-Up: At [facility], you and your health needs are our priority.  As part of our continuing mission to provide you with exceptional heart care, we have created designated Provider Care Teams.  These Care Teams include your primary Cardiologist (physician) and Advanced Practice Providers (APPs -  Physician Assistants and Nurse Practitioners) who all work together to provide you with the care you need, when you need it.  We recommend signing up for the patient portal called "MyChart".  Sign up information is provided on this After Visit Summary.  MyChart is used to connect with patients for Virtual Visits (Telemedicine).  Patients are able to view lab/test results, encounter notes, upcoming appointments, etc.  Non-urgent messages can be sent to your provider as well.   To learn more about what you can do with MyChart, go to [email or web address].    Your next appointment:    1 year(s)  Provider:     You may see Dr. Ladona Ridgel  or one of the following Advanced Practice Providers on your designated Care Team:   Francis Dowse, [place]   Other Instructions

## 2023-03-19 ENCOUNTER — Ambulatory Visit (INDEPENDENT_AMBULATORY_CARE_PROVIDER_SITE_OTHER): Payer: Medicare HMO

## 2023-03-19 DIAGNOSIS — I495 Sick sinus syndrome: Secondary | ICD-10-CM

## 2023-03-19 LAB — CUP PACEART REMOTE DEVICE CHECK
Battery Remaining Longevity: 66 mo
Battery Remaining Percentage: 84 %
Brady Statistic RV Percent Paced: 43 %
Date Time Interrogation Session: 20250204031100
Implantable Lead Connection Status: 753985
Implantable Lead Implant Date: 20210809
Implantable Lead Location: 753860
Implantable Lead Model: 7842
Implantable Lead Serial Number: 1052152
Implantable Pulse Generator Implant Date: 20210809
Lead Channel Impedance Value: 669 Ohm
Lead Channel Pacing Threshold Amplitude: 0.8 V
Lead Channel Pacing Threshold Pulse Width: 0.4 ms
Lead Channel Setting Pacing Amplitude: 1.5 V
Lead Channel Setting Pacing Pulse Width: 0.4 ms
Lead Channel Setting Sensing Sensitivity: 2.5 mV
Pulse Gen Serial Number: 813574
Zone Setting Status: 755011

## 2023-04-25 NOTE — Progress Notes (Signed)
 Remote pacemaker transmission.

## 2023-05-08 ENCOUNTER — Other Ambulatory Visit: Payer: Self-pay | Admitting: Internal Medicine

## 2023-05-30 ENCOUNTER — Ambulatory Visit
Admission: RE | Admit: 2023-05-30 | Discharge: 2023-05-30 | Disposition: A | Discharge status: Home / Self Care | Payer: Medicare HMO | Source: Ambulatory Visit | Attending: Internal Medicine | Admitting: Internal Medicine

## 2023-05-30 DIAGNOSIS — M810 Age-related osteoporosis without current pathological fracture: Secondary | ICD-10-CM | POA: Diagnosis not present

## 2023-05-30 DIAGNOSIS — M8580 Other specified disorders of bone density and structure, unspecified site: Secondary | ICD-10-CM

## 2023-05-30 DIAGNOSIS — Z79899 Other long term (current) drug therapy: Secondary | ICD-10-CM | POA: Diagnosis not present

## 2023-05-31 LAB — DIGOXIN LEVEL: Digoxin, Serum: 0.5 ng/mL (ref 0.5–0.9)

## 2023-06-12 ENCOUNTER — Other Ambulatory Visit: Payer: Self-pay | Admitting: Internal Medicine

## 2023-06-12 DIAGNOSIS — D6869 Other thrombophilia: Secondary | ICD-10-CM | POA: Diagnosis not present

## 2023-06-12 DIAGNOSIS — M810 Age-related osteoporosis without current pathological fracture: Secondary | ICD-10-CM | POA: Diagnosis not present

## 2023-06-12 DIAGNOSIS — I495 Sick sinus syndrome: Secondary | ICD-10-CM | POA: Diagnosis not present

## 2023-06-12 DIAGNOSIS — I480 Paroxysmal atrial fibrillation: Secondary | ICD-10-CM | POA: Diagnosis not present

## 2023-06-12 DIAGNOSIS — Z950 Presence of cardiac pacemaker: Secondary | ICD-10-CM | POA: Diagnosis not present

## 2023-06-12 DIAGNOSIS — R911 Solitary pulmonary nodule: Secondary | ICD-10-CM

## 2023-06-18 ENCOUNTER — Ambulatory Visit (INDEPENDENT_AMBULATORY_CARE_PROVIDER_SITE_OTHER): Payer: Medicare HMO

## 2023-06-18 DIAGNOSIS — I495 Sick sinus syndrome: Secondary | ICD-10-CM

## 2023-06-18 LAB — CUP PACEART REMOTE DEVICE CHECK
Battery Remaining Longevity: 72 mo
Battery Remaining Percentage: 86 %
Brady Statistic RV Percent Paced: 47 %
Date Time Interrogation Session: 20250506031100
Implantable Lead Connection Status: 753985
Implantable Lead Implant Date: 20210809
Implantable Lead Location: 753860
Implantable Lead Model: 7842
Implantable Lead Serial Number: 1052152
Implantable Pulse Generator Implant Date: 20210809
Lead Channel Impedance Value: 658 Ohm
Lead Channel Pacing Threshold Amplitude: 0.8 V
Lead Channel Pacing Threshold Pulse Width: 0.4 ms
Lead Channel Setting Pacing Amplitude: 1.3 V
Lead Channel Setting Pacing Pulse Width: 0.4 ms
Lead Channel Setting Sensing Sensitivity: 2.5 mV
Pulse Gen Serial Number: 813574
Zone Setting Status: 755011

## 2023-06-19 ENCOUNTER — Encounter: Payer: Self-pay | Admitting: Internal Medicine

## 2023-06-27 ENCOUNTER — Ambulatory Visit
Admission: RE | Admit: 2023-06-27 | Discharge: 2023-06-27 | Disposition: A | Discharge status: Home / Self Care | Source: Ambulatory Visit | Attending: Internal Medicine | Admitting: Internal Medicine

## 2023-06-27 DIAGNOSIS — R222 Localized swelling, mass and lump, trunk: Secondary | ICD-10-CM | POA: Diagnosis not present

## 2023-06-27 DIAGNOSIS — I2510 Atherosclerotic heart disease of native coronary artery without angina pectoris: Secondary | ICD-10-CM | POA: Diagnosis not present

## 2023-06-27 DIAGNOSIS — R918 Other nonspecific abnormal finding of lung field: Secondary | ICD-10-CM | POA: Diagnosis not present

## 2023-06-27 DIAGNOSIS — J439 Emphysema, unspecified: Secondary | ICD-10-CM | POA: Diagnosis not present

## 2023-06-27 DIAGNOSIS — R911 Solitary pulmonary nodule: Secondary | ICD-10-CM | POA: Diagnosis not present

## 2023-07-01 DIAGNOSIS — L02213 Cutaneous abscess of chest wall: Secondary | ICD-10-CM | POA: Diagnosis not present

## 2023-07-13 DIAGNOSIS — F3340 Major depressive disorder, recurrent, in remission, unspecified: Secondary | ICD-10-CM | POA: Diagnosis not present

## 2023-07-13 DIAGNOSIS — I480 Paroxysmal atrial fibrillation: Secondary | ICD-10-CM | POA: Diagnosis not present

## 2023-07-13 DIAGNOSIS — E7800 Pure hypercholesterolemia, unspecified: Secondary | ICD-10-CM | POA: Diagnosis not present

## 2023-07-13 DIAGNOSIS — J439 Emphysema, unspecified: Secondary | ICD-10-CM | POA: Diagnosis not present

## 2023-07-19 DIAGNOSIS — M810 Age-related osteoporosis without current pathological fracture: Secondary | ICD-10-CM | POA: Diagnosis not present

## 2023-07-19 DIAGNOSIS — D6869 Other thrombophilia: Secondary | ICD-10-CM | POA: Diagnosis not present

## 2023-07-19 DIAGNOSIS — J439 Emphysema, unspecified: Secondary | ICD-10-CM | POA: Diagnosis not present

## 2023-07-19 DIAGNOSIS — I480 Paroxysmal atrial fibrillation: Secondary | ICD-10-CM | POA: Diagnosis not present

## 2023-07-19 DIAGNOSIS — I495 Sick sinus syndrome: Secondary | ICD-10-CM | POA: Diagnosis not present

## 2023-07-19 DIAGNOSIS — E041 Nontoxic single thyroid nodule: Secondary | ICD-10-CM | POA: Diagnosis not present

## 2023-07-19 DIAGNOSIS — Z950 Presence of cardiac pacemaker: Secondary | ICD-10-CM | POA: Diagnosis not present

## 2023-07-19 DIAGNOSIS — R911 Solitary pulmonary nodule: Secondary | ICD-10-CM | POA: Diagnosis not present

## 2023-07-19 DIAGNOSIS — F17210 Nicotine dependence, cigarettes, uncomplicated: Secondary | ICD-10-CM | POA: Diagnosis not present

## 2023-07-31 NOTE — Progress Notes (Signed)
 Remote pacemaker transmission.

## 2023-08-12 DIAGNOSIS — I480 Paroxysmal atrial fibrillation: Secondary | ICD-10-CM | POA: Diagnosis not present

## 2023-08-12 DIAGNOSIS — J439 Emphysema, unspecified: Secondary | ICD-10-CM | POA: Diagnosis not present

## 2023-08-12 DIAGNOSIS — E7800 Pure hypercholesterolemia, unspecified: Secondary | ICD-10-CM | POA: Diagnosis not present

## 2023-08-12 DIAGNOSIS — F3340 Major depressive disorder, recurrent, in remission, unspecified: Secondary | ICD-10-CM | POA: Diagnosis not present

## 2023-08-23 DIAGNOSIS — E041 Nontoxic single thyroid nodule: Secondary | ICD-10-CM | POA: Diagnosis not present

## 2023-09-17 ENCOUNTER — Ambulatory Visit (INDEPENDENT_AMBULATORY_CARE_PROVIDER_SITE_OTHER): Payer: Medicare HMO

## 2023-09-17 DIAGNOSIS — I495 Sick sinus syndrome: Secondary | ICD-10-CM | POA: Diagnosis not present

## 2023-09-18 LAB — CUP PACEART REMOTE DEVICE CHECK
Battery Remaining Longevity: 66 mo
Battery Remaining Percentage: 83 %
Brady Statistic RV Percent Paced: 43 %
Date Time Interrogation Session: 20250805031100
Implantable Lead Connection Status: 753985
Implantable Lead Implant Date: 20210809
Implantable Lead Location: 753860
Implantable Lead Model: 7842
Implantable Lead Serial Number: 1052152
Implantable Pulse Generator Implant Date: 20210809
Lead Channel Impedance Value: 690 Ohm
Lead Channel Pacing Threshold Amplitude: 0.9 V
Lead Channel Pacing Threshold Pulse Width: 0.4 ms
Lead Channel Setting Pacing Amplitude: 1.4 V
Lead Channel Setting Pacing Pulse Width: 0.4 ms
Lead Channel Setting Sensing Sensitivity: 2.5 mV
Pulse Gen Serial Number: 813574
Zone Setting Status: 755011

## 2023-09-19 ENCOUNTER — Ambulatory Visit: Payer: Self-pay | Admitting: Internal Medicine

## 2023-09-25 DIAGNOSIS — J439 Emphysema, unspecified: Secondary | ICD-10-CM | POA: Diagnosis not present

## 2023-09-25 DIAGNOSIS — E7800 Pure hypercholesterolemia, unspecified: Secondary | ICD-10-CM | POA: Diagnosis not present

## 2023-09-25 DIAGNOSIS — Z0000 Encounter for general adult medical examination without abnormal findings: Secondary | ICD-10-CM | POA: Diagnosis not present

## 2023-09-25 DIAGNOSIS — E538 Deficiency of other specified B group vitamins: Secondary | ICD-10-CM | POA: Diagnosis not present

## 2023-09-25 DIAGNOSIS — E559 Vitamin D deficiency, unspecified: Secondary | ICD-10-CM | POA: Diagnosis not present

## 2023-09-25 DIAGNOSIS — F17211 Nicotine dependence, cigarettes, in remission: Secondary | ICD-10-CM | POA: Diagnosis not present

## 2023-09-25 DIAGNOSIS — I480 Paroxysmal atrial fibrillation: Secondary | ICD-10-CM | POA: Diagnosis not present

## 2023-09-25 DIAGNOSIS — F3340 Major depressive disorder, recurrent, in remission, unspecified: Secondary | ICD-10-CM | POA: Diagnosis not present

## 2023-09-25 DIAGNOSIS — Z1331 Encounter for screening for depression: Secondary | ICD-10-CM | POA: Diagnosis not present

## 2023-09-25 DIAGNOSIS — Z23 Encounter for immunization: Secondary | ICD-10-CM | POA: Diagnosis not present

## 2023-09-25 DIAGNOSIS — Z79899 Other long term (current) drug therapy: Secondary | ICD-10-CM | POA: Diagnosis not present

## 2023-09-25 DIAGNOSIS — D6869 Other thrombophilia: Secondary | ICD-10-CM | POA: Diagnosis not present

## 2023-09-25 DIAGNOSIS — M810 Age-related osteoporosis without current pathological fracture: Secondary | ICD-10-CM | POA: Diagnosis not present

## 2023-09-25 DIAGNOSIS — I495 Sick sinus syndrome: Secondary | ICD-10-CM | POA: Diagnosis not present

## 2023-10-16 ENCOUNTER — Other Ambulatory Visit: Payer: Self-pay | Admitting: Internal Medicine

## 2023-10-16 DIAGNOSIS — Z1231 Encounter for screening mammogram for malignant neoplasm of breast: Secondary | ICD-10-CM

## 2023-10-22 ENCOUNTER — Ambulatory Visit
Admission: RE | Admit: 2023-10-22 | Discharge: 2023-10-22 | Disposition: A | Discharge status: Home / Self Care | Source: Ambulatory Visit | Attending: Internal Medicine | Admitting: Internal Medicine

## 2023-10-22 DIAGNOSIS — Z1231 Encounter for screening mammogram for malignant neoplasm of breast: Secondary | ICD-10-CM | POA: Diagnosis not present

## 2023-10-29 DIAGNOSIS — Z961 Presence of intraocular lens: Secondary | ICD-10-CM | POA: Diagnosis not present

## 2023-10-29 DIAGNOSIS — H43813 Vitreous degeneration, bilateral: Secondary | ICD-10-CM | POA: Diagnosis not present

## 2023-10-29 DIAGNOSIS — H353131 Nonexudative age-related macular degeneration, bilateral, early dry stage: Secondary | ICD-10-CM | POA: Diagnosis not present

## 2023-10-29 DIAGNOSIS — H04123 Dry eye syndrome of bilateral lacrimal glands: Secondary | ICD-10-CM | POA: Diagnosis not present

## 2023-10-29 DIAGNOSIS — H524 Presbyopia: Secondary | ICD-10-CM | POA: Diagnosis not present

## 2023-11-11 NOTE — Progress Notes (Signed)
 Remote PPM Transmission

## 2023-12-16 ENCOUNTER — Other Ambulatory Visit: Payer: Self-pay | Admitting: Internal Medicine

## 2023-12-16 DIAGNOSIS — I4821 Permanent atrial fibrillation: Secondary | ICD-10-CM

## 2023-12-17 ENCOUNTER — Ambulatory Visit: Payer: Medicare HMO

## 2023-12-17 DIAGNOSIS — I4821 Permanent atrial fibrillation: Secondary | ICD-10-CM | POA: Diagnosis not present

## 2023-12-17 LAB — CUP PACEART REMOTE DEVICE CHECK
Battery Remaining Longevity: 66 mo
Battery Remaining Percentage: 78 %
Brady Statistic RV Percent Paced: 41 %
Date Time Interrogation Session: 20251104031100
Implantable Lead Connection Status: 753985
Implantable Lead Implant Date: 20210809
Implantable Lead Location: 753860
Implantable Lead Model: 7842
Implantable Lead Serial Number: 1052152
Implantable Pulse Generator Implant Date: 20210809
Lead Channel Impedance Value: 654 Ohm
Lead Channel Pacing Threshold Amplitude: 0.9 V
Lead Channel Pacing Threshold Pulse Width: 0.4 ms
Lead Channel Setting Pacing Amplitude: 1.3 V
Lead Channel Setting Pacing Pulse Width: 0.4 ms
Lead Channel Setting Sensing Sensitivity: 2.5 mV
Pulse Gen Serial Number: 813574
Zone Setting Status: 755011

## 2023-12-17 NOTE — Telephone Encounter (Signed)
 58F, wt 155#, Scr 0.72 06/13/23, crcl 72ml/min

## 2023-12-20 ENCOUNTER — Ambulatory Visit: Payer: Self-pay | Admitting: Internal Medicine

## 2023-12-23 NOTE — Progress Notes (Signed)
 Remote PPM Transmission

## 2024-03-16 ENCOUNTER — Other Ambulatory Visit: Payer: Self-pay | Admitting: Internal Medicine

## 2024-03-17 ENCOUNTER — Ambulatory Visit: Payer: Medicare HMO

## 2024-03-18 LAB — CUP PACEART REMOTE DEVICE CHECK
Battery Remaining Longevity: 60 mo
Battery Remaining Percentage: 75 %
Brady Statistic RV Percent Paced: 42 %
Date Time Interrogation Session: 20260203031100
Implantable Lead Connection Status: 753985
Implantable Lead Implant Date: 20210809
Implantable Lead Location: 753860
Implantable Lead Model: 7842
Implantable Lead Serial Number: 1052152
Implantable Pulse Generator Implant Date: 20210809
Lead Channel Impedance Value: 631 Ohm
Lead Channel Pacing Threshold Amplitude: 0.9 V
Lead Channel Pacing Threshold Pulse Width: 0.4 ms
Lead Channel Setting Pacing Amplitude: 1.4 V
Lead Channel Setting Pacing Pulse Width: 0.4 ms
Lead Channel Setting Sensing Sensitivity: 2.5 mV
Pulse Gen Serial Number: 813574
Zone Setting Status: 755011

## 2024-03-19 NOTE — Telephone Encounter (Signed)
 In accordance with refill protocols, please review and address the following requirements before this medication refill can be authorized:  EKG  and Labs  Pt needs labs within 365 days within normal range

## 2024-03-23 ENCOUNTER — Ambulatory Visit: Admitting: Pulmonary Disease

## 2024-06-16 ENCOUNTER — Ambulatory Visit

## 2024-09-15 ENCOUNTER — Ambulatory Visit

## 2024-12-15 ENCOUNTER — Ambulatory Visit

## 2025-03-16 ENCOUNTER — Ambulatory Visit

## 2025-06-15 ENCOUNTER — Ambulatory Visit
